# Patient Record
Sex: Male | Born: 1950 | Race: White | Hispanic: No | Marital: Married | Smoking: Never smoker
Health system: Southern US, Community
[De-identification: ages and names within clinical notes are randomized; demographics above are authoritative.]

## PROBLEM LIST (undated history)

## (undated) DIAGNOSIS — K759 Inflammatory liver disease, unspecified: Secondary | ICD-10-CM

## (undated) DIAGNOSIS — G459 Transient cerebral ischemic attack, unspecified: Secondary | ICD-10-CM

## (undated) DIAGNOSIS — N200 Calculus of kidney: Secondary | ICD-10-CM

## (undated) DIAGNOSIS — K219 Gastro-esophageal reflux disease without esophagitis: Secondary | ICD-10-CM

## (undated) DIAGNOSIS — E785 Hyperlipidemia, unspecified: Secondary | ICD-10-CM

## (undated) HISTORY — DX: Calculus of kidney: N20.0

## (undated) HISTORY — DX: Inflammatory liver disease, unspecified: K75.9

## (undated) HISTORY — DX: Hyperlipidemia, unspecified: E78.5

## (undated) HISTORY — DX: Gastro-esophageal reflux disease without esophagitis: K21.9

## (undated) HISTORY — PX: CHOLECYSTECTOMY: SHX55

## (undated) HISTORY — DX: Transient cerebral ischemic attack, unspecified: G45.9

## (undated) HISTORY — PX: HERNIA REPAIR: SHX51

---

## 2009-07-01 LAB — HM COLONOSCOPY: HM Colonoscopy: NORMAL

## 2014-03-11 LAB — HEPATIC FUNCTION PANEL
ALK PHOS: 63 U/L (ref 25–125)
ALT: 27 U/L (ref 10–40)
AST: 24 U/L (ref 14–40)
Bilirubin, Total: 1.9 mg/dL

## 2014-03-11 LAB — TSH: TSH: 1.92 u[IU]/mL (ref <=5.90)

## 2014-03-11 LAB — CBC AND DIFFERENTIAL
HCT: 47 % (ref 41–53)
Hemoglobin: 16.3 g/dL (ref 13.5–17.5)
NEUTROS ABS: 65 /uL
Platelets: 151 10*3/uL (ref 150–399)
WBC: 5.8 10*3/mL

## 2014-03-11 LAB — LIPID PANEL
Cholesterol: 191 mg/dL (ref 0–200)
HDL: 33 mg/dL — AB (ref 35–70)
LDL CALC: 126 mg/dL
LDL/HDL RATIO: 3.8
Triglycerides: 159 mg/dL (ref 40–160)

## 2014-03-11 LAB — BASIC METABOLIC PANEL
BUN: 12 mg/dL (ref 4–21)
CREATININE: 1 mg/dL (ref <=1.3)
Glucose: 100 mg/dL
Potassium: 4.2 mmol/L (ref 3.4–5.3)
Sodium: 142 mmol/L (ref 137–147)

## 2014-07-16 DIAGNOSIS — Z87442 Personal history of urinary calculi: Secondary | ICD-10-CM | POA: Insufficient documentation

## 2014-07-16 DIAGNOSIS — Z8673 Personal history of transient ischemic attack (TIA), and cerebral infarction without residual deficits: Secondary | ICD-10-CM | POA: Insufficient documentation

## 2014-07-16 DIAGNOSIS — R413 Other amnesia: Secondary | ICD-10-CM | POA: Insufficient documentation

## 2014-07-16 DIAGNOSIS — G4733 Obstructive sleep apnea (adult) (pediatric): Secondary | ICD-10-CM | POA: Insufficient documentation

## 2014-07-16 DIAGNOSIS — K219 Gastro-esophageal reflux disease without esophagitis: Secondary | ICD-10-CM | POA: Insufficient documentation

## 2014-07-16 DIAGNOSIS — E559 Vitamin D deficiency, unspecified: Secondary | ICD-10-CM | POA: Insufficient documentation

## 2014-07-16 DIAGNOSIS — Z8619 Personal history of other infectious and parasitic diseases: Secondary | ICD-10-CM | POA: Insufficient documentation

## 2014-07-16 DIAGNOSIS — J309 Allergic rhinitis, unspecified: Secondary | ICD-10-CM | POA: Insufficient documentation

## 2014-07-16 DIAGNOSIS — E785 Hyperlipidemia, unspecified: Secondary | ICD-10-CM | POA: Insufficient documentation

## 2014-07-29 ENCOUNTER — Encounter
Admission: RE | Disposition: A | Discharge status: Home / Self Care | Payer: Self-pay | Source: Ambulatory Visit | Attending: Gastroenterology

## 2014-07-29 ENCOUNTER — Ambulatory Visit: Payer: 59 | Admitting: Anesthesiology

## 2014-07-29 ENCOUNTER — Ambulatory Visit
Admission: RE | Admit: 2014-07-29 | Discharge: 2014-07-29 | Disposition: A | Discharge status: Home / Self Care | Payer: 59 | Source: Ambulatory Visit | Attending: Gastroenterology | Admitting: Gastroenterology

## 2014-07-29 ENCOUNTER — Encounter: Payer: Self-pay | Admitting: *Deleted

## 2014-07-29 DIAGNOSIS — K5730 Diverticulosis of large intestine without perforation or abscess without bleeding: Secondary | ICD-10-CM | POA: Insufficient documentation

## 2014-07-29 DIAGNOSIS — K759 Inflammatory liver disease, unspecified: Secondary | ICD-10-CM | POA: Insufficient documentation

## 2014-07-29 DIAGNOSIS — I69851 Hemiplegia and hemiparesis following other cerebrovascular disease affecting right dominant side: Secondary | ICD-10-CM | POA: Diagnosis not present

## 2014-07-29 DIAGNOSIS — N200 Calculus of kidney: Secondary | ICD-10-CM | POA: Insufficient documentation

## 2014-07-29 DIAGNOSIS — Z86010 Personal history of colonic polyps: Secondary | ICD-10-CM | POA: Diagnosis not present

## 2014-07-29 DIAGNOSIS — I69892 Facial weakness following other cerebrovascular disease: Secondary | ICD-10-CM | POA: Insufficient documentation

## 2014-07-29 DIAGNOSIS — Z1211 Encounter for screening for malignant neoplasm of colon: Secondary | ICD-10-CM | POA: Diagnosis not present

## 2014-07-29 DIAGNOSIS — K635 Polyp of colon: Secondary | ICD-10-CM | POA: Insufficient documentation

## 2014-07-29 DIAGNOSIS — K219 Gastro-esophageal reflux disease without esophagitis: Secondary | ICD-10-CM | POA: Insufficient documentation

## 2014-07-29 DIAGNOSIS — Z7982 Long term (current) use of aspirin: Secondary | ICD-10-CM | POA: Insufficient documentation

## 2014-07-29 DIAGNOSIS — Z79899 Other long term (current) drug therapy: Secondary | ICD-10-CM | POA: Insufficient documentation

## 2014-07-29 DIAGNOSIS — G4730 Sleep apnea, unspecified: Secondary | ICD-10-CM | POA: Insufficient documentation

## 2014-07-29 HISTORY — PX: COLONOSCOPY: SHX5424

## 2014-07-29 SURGERY — COLONOSCOPY
Anesthesia: Choice

## 2014-07-29 SURGERY — COLONOSCOPY
Anesthesia: General

## 2014-07-29 MED ORDER — SODIUM CHLORIDE 0.9 % IV SOLN: INTRAVENOUS | Status: DC

## 2014-07-29 MED ORDER — PROPOFOL INFUSION 10 MG/ML OPTIME
INTRAVENOUS | Status: DC | PRN
Start: 2014-07-29 — End: 2014-07-29
  Administered 2014-07-29: 160 ug/kg/min via INTRAVENOUS

## 2014-07-29 MED ORDER — FENTANYL CITRATE (PF) 100 MCG/2ML IJ SOLN
INTRAMUSCULAR | Status: DC | PRN
  Administered 2014-07-29: 25 ug via INTRAVENOUS

## 2014-07-29 MED ORDER — SODIUM CHLORIDE 0.9 % IV SOLN
INTRAVENOUS | Status: DC
  Administered 2014-07-29: 10:00:00 via INTRAVENOUS

## 2014-07-29 MED ORDER — MIDAZOLAM HCL 2 MG/2ML IJ SOLN
INTRAMUSCULAR | Status: DC | PRN
Start: 2014-07-29 — End: 2014-07-29
  Administered 2014-07-29: 1 mg via INTRAVENOUS

## 2014-07-29 NOTE — Anesthesia Preprocedure Evaluation (Signed)
Anesthesia Evaluation  Patient identified by MRN, date of birth, ID band Patient awake    Reviewed: Allergy & Precautions, NPO status , Patient's Chart, lab work & pertinent test results  Airway Mallampati: II  TM Distance: >3 FB Neck ROM: Full    Dental  (+) Teeth Intact   Pulmonary sleep apnea and Continuous Positive Airway Pressure Ventilation ,          Cardiovascular     Neuro/Psych TIA (R sided weakness, facial droop)   GI/Hepatic GERD-  ,(+) Hepatitis - (Gilbert syndrome), A  Endo/Other    Renal/GU Renal disease (Stones)     Musculoskeletal   Abdominal   Peds  Hematology   Anesthesia Other Findings   Reproductive/Obstetrics                             Anesthesia Physical Anesthesia Plan  ASA: II  Anesthesia Plan: General   Post-op Pain Management:    Induction: Intravenous  Airway Management Planned: Nasal Cannula  Additional Equipment:   Intra-op Plan:   Post-operative Plan:   Informed Consent: I have reviewed the patients History and Physical, chart, labs and discussed the procedure including the risks, benefits and alternatives for the proposed anesthesia with the patient or authorized representative who has indicated his/her understanding and acceptance.     Plan Discussed with:   Anesthesia Plan Comments:         Anesthesia Quick Evaluation

## 2014-07-29 NOTE — Transfer of Care (Signed)
Immediate Anesthesia Transfer of Care Note  Patient: Zachary Medina  Procedure(s) Performed: Procedure(s): COLONOSCOPY (N/A)  Patient Location: PACU and Endoscopy Unit  Anesthesia Type:General  Level of Consciousness: sedated and responds to stimulation  Airway & Oxygen Therapy: Patient Spontanous Breathing and Patient connected to nasal cannula oxygen  Post-op Assessment: Report given to RN and Post -op Vital signs reviewed and stable  Post vital signs: Reviewed and stable  Last Vitals:  Filed Vitals:   07/29/14 0938  BP: 120/68  Pulse: 54  Temp: 35.9 C  Resp: 18    Complications: No apparent anesthesia complications

## 2014-07-29 NOTE — Anesthesia Postprocedure Evaluation (Signed)
  Anesthesia Post-op Note  Patient: Zachary Medina  Procedure(s) Performed: Procedure(s): COLONOSCOPY (N/A)  Anesthesia type:General  Patient location: PACU  Post pain: Pain level controlled  Post assessment: Post-op Vital signs reviewed, Patient's Cardiovascular Status Stable, Respiratory Function Stable, Patent Airway and No signs of Nausea or vomiting  Post vital signs: Reviewed and stable  Last Vitals:  Filed Vitals:   07/29/14 0938  BP: 120/68  Pulse: 54  Temp: 35.9 C  Resp: 18    Level of consciousness: awake, alert  and patient cooperative  Complications: No apparent anesthesia complications

## 2014-07-29 NOTE — Op Note (Signed)
[facility] Patient Name: Zachary Medina Procedure Date: 07/29/2014 10:29 AM MRN: [id number] Account #: [id number] Date of Birth: 08-04-1950 Admit Type: Outpatient Age: 64 Room: [facility] ENDO ROOM 4 Gender: Male Note Status: Finalized Procedure:         Colonoscopy Indications:       Screening for colorectal malignant neoplasm Providers:         Lucilla Lame, MD Referring MD:      Janine Ores. Rosanna Randy, MD (Referring MD) Medicines:         Propofol per Anesthesia Complications:     No immediate complications. Procedure:         Pre-Anesthesia Assessment:                    - Prior to the procedure, a History and Physical was                     performed, and patient medications and allergies were                     reviewed. The patient's tolerance of previous anesthesia                     was also reviewed. The risks and benefits of the procedure                     and the sedation options and risks were discussed with the                     patient. All questions were answered, and informed consent                     was obtained. Prior Anticoagulants: The patient has taken                     no previous anticoagulant or antiplatelet agents. ASA                     Grade Assessment: II - A patient with mild systemic                     disease. After reviewing the risks and benefits, the                     patient was deemed in satisfactory condition to undergo                     the procedure.                    After obtaining informed consent, the colonoscope was                     passed under direct vision. Throughout the procedure, the                     patient's blood pressure, pulse, and oxygen saturations                     were monitored continuously. The Olympus CF-Q160AL                     colonoscope (S#. [id number]) was introduced through the anus  and advanced to the the cecum, identified by appendiceal                orifice and ileocecal valve. The colonoscopy was performed                     without difficulty. The patient tolerated the procedure                     well. The quality of the bowel preparation was excellent. Findings:      The perianal and digital rectal examinations were normal.      Four sessile polyps were found in the sigmoid colon. The polyps were 2       to 4 mm in size. These polyps were removed with a cold biopsy forceps.       Resection and retrieval were complete.      A few small-mouthed diverticula were found in the sigmoid colon. Impression:        - Four 2 to 4 mm polyps in the sigmoid colon. Resected and                     retrieved.                    - Diverticulosis in the sigmoid colon. Recommendation:    - Await pathology results.                    - Repeat colonoscopy in 5 years if polyp adenoma and 10                     years if hyperplastic Procedure Code(s): --- Professional ---                    [phone], Colonoscopy, flexible; with biopsy, single or                     multiple Diagnosis Code(s): --- Professional ---                    Z12.11, Encounter for screening for malignant neoplasm of                     colon                    D12.5, Benign neoplasm of sigmoid colon CPT copyright 2014 American Medical Association. All rights reserved. The codes documented in this report are preliminary and upon coder review may  be revised to meet current compliance requirements. Lucilla Lame, MD 07/29/2014 10:55:58 AM This report has been signed electronically. Number of Addenda: 0 Note Initiated On: 07/29/2014 10:29 AM Scope Withdrawal Time: 0 hours 9 minutes 11 seconds  Total Procedure Duration: 0 hours 11 minutes 41 seconds       [facility]

## 2014-07-29 NOTE — Anesthesia Procedure Notes (Signed)
Performed by: Tiera Mensinger Pre-anesthesia Checklist: Patient identified, Emergency Drugs available, Suction available, Patient being monitored and Timeout performed Patient Re-evaluated:Patient Re-evaluated prior to inductionOxygen Delivery Method: Nasal cannula Preoxygenation: Pre-oxygenation with 100% oxygen Intubation Type: IV induction     

## 2014-07-29 NOTE — H&P (Signed)
  [facility]  [address] [place], [place] [address] Phone: [phone] Fax : [phone]  Primary Care Physician:  Miguel Aschoff, MD Primary Gastroenterologist:  Dr. Allen Norris  Pre-Procedure History & Physical: HPI:  Zachary Medina is a 64 y.o. male is here for an colonoscopy.   History reviewed. No pertinent past medical history.  Past Surgical History  Procedure Laterality Date  . Hernia repair    . Cholecystectomy      Prior to Admission medications   Medication Sig Start Date End Date Taking? Authorizing Provider  aspirin 81 MG tablet Take 81 mg by mouth daily.    Yes Historical Provider, MD    Allergies as of 07/11/2014  . (Not on File)    Family History  Problem Relation Age of Onset  . Arthritis Mother   . Glaucoma Mother   . Congestive Heart Failure Father   . Parkinson's disease Father   . Prostate cancer Father   . Arthritis Father   . Heart disease Father   . Diabetes Brother     History   Social History  . Marital Status: Married    Spouse Name: N/A  . Number of Children: N/A  . Years of Education: N/A   Occupational History  . Not on file.   Social History Main Topics  . Smoking status: Never Smoker   . Smokeless tobacco: Not on file  . Alcohol Use: No  . Drug Use: No  . Sexual Activity: Not on file   Other Topics Concern  . Not on file   Social History Narrative    Review of Systems: See HPI, otherwise negative ROS  Physical Exam: BP 120/68 mmHg  Pulse 54  Temp(Src) 96.7 F (35.9 C) (Tympanic)  Resp 18  Ht 5\' 8"  (1.727 m)  Wt 165 lb (74.844 kg)  BMI 25.09 kg/m2  SpO2 100% General:   Alert,  pleasant and cooperative in NAD Head:  Normocephalic and atraumatic. Neck:  Supple; no masses or thyromegaly. Lungs:  Clear throughout to auscultation.    Heart:  Regular rate and rhythm. Abdomen:  Soft, nontender and nondistended. Normal bowel sounds, without guarding, and without rebound.   Neurologic:  Alert  and  oriented x4;  grossly normal neurologically.  Impression/Plan: Zachary Medina is here for an colonoscopy to be performed for history of polyps  Risks, benefits, limitations, and alternatives regarding  colonoscopy have been reviewed with the patient.  Questions have been answered.  All parties agreeable.   [facility], MD  07/29/2014, 10:13 AM

## 2014-07-30 LAB — SURGICAL PATHOLOGY

## 2014-08-05 ENCOUNTER — Encounter: Payer: Self-pay | Admitting: Gastroenterology

## 2014-08-25 ENCOUNTER — Ambulatory Visit: Payer: Self-pay | Admitting: Family Medicine

## 2014-09-01 ENCOUNTER — Encounter: Payer: Self-pay | Admitting: Family Medicine

## 2014-09-01 ENCOUNTER — Ambulatory Visit (INDEPENDENT_AMBULATORY_CARE_PROVIDER_SITE_OTHER): Payer: 59 | Admitting: Family Medicine

## 2014-09-01 VITALS — BP 118/76 | HR 64 | Temp 97.7°F | Resp 16 | Wt 171.0 lb

## 2014-09-01 DIAGNOSIS — M533 Sacrococcygeal disorders, not elsewhere classified: Secondary | ICD-10-CM

## 2014-09-01 DIAGNOSIS — M722 Plantar fascial fibromatosis: Secondary | ICD-10-CM | POA: Diagnosis not present

## 2014-09-01 DIAGNOSIS — K219 Gastro-esophageal reflux disease without esophagitis: Secondary | ICD-10-CM | POA: Diagnosis not present

## 2014-09-01 MED ORDER — NAPROXEN 500 MG PO TABS: 500.0000 mg | ORAL_TABLET | Freq: Two times a day (BID) | ORAL | Status: DC

## 2014-09-01 NOTE — Progress Notes (Signed)
Patient ID: Zachary Medina, male   DOB: 1950-05-13, 64 y.o.   MRN: [id number]   Xaivier Malay  MRN: [id number] DOB: 06-27-1950  Subjective:  HPI  1. Gastroesophageal reflux disease, esophagitis presence not specified Patient was seen about 2 months for acid reflux and was treated with Zantac.  He took it daily for about 1 month and now only takes it as needed, which is a couple times per week.  He does notice that when he eats fast, is anxious or stressed he has more problems.  2. Coccyx pain Patient states he has been having tenderness that he believes to be his tailbone.  He notices it mostly when sitting.  It has been going on for 1 month.  He states he has never had an injury to the area, he recently had colonoscopy with removal of some polyps.  He had normal PSA with his PE in 2016.   Patient Active Problem List   Diagnosis Date Noted  . Allergic rhinitis 07/16/2014  . Bilirubinemia 07/16/2014  . Acid reflux 07/16/2014  . 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 H/O renal calculi 07/16/2014  . H/O transient cerebral ischemia 07/16/2014  . Bad memory 07/16/2014  . Obstructive apnea 07/16/2014  . HLD (hyperlipidemia) 07/16/2014  . Avitaminosis D 07/16/2014    Past Medical History  Diagnosis Date  . GERD (gastroesophageal reflux disease)   . Hyperlipidemia   . Hepatitis   . TIA (transient ischemic attack)   . Kidney stones     History   Social History  . Marital Status: Married    Spouse Name: N/A  . Number of Children: N/A  . Years of Education: N/A   Occupational History  . Not on file.   Social History Main Topics  . Smoking status: Never Smoker   . Smokeless tobacco: Not on file  . Alcohol Use: No  . Drug Use: No  . Sexual Activity: Not on file   Other Topics Concern  . Not on file   Social History Narrative    Outpatient Prescriptions Prior to Visit  Medication Sig Dispense Refill  . aspirin 81 MG tablet Take 81 mg by mouth daily.      No  facility-administered medications prior to visit.    No Known Allergies  Review of Systems  Respiratory: Positive for cough (chronic with no change).   Cardiovascular: Negative.   Gastrointestinal: Positive for diarrhea (Occassional loose stool after eating our.).       Patient describes his discomfort as a lump in his throat and not really heartburn.  Musculoskeletal: Negative.        Heel pain when he first gets up.   Objective:  BP 118/76 mmHg  Pulse 64  Temp(Src) 97.7 F (36.5 C) (Oral)  Resp 16  Wt 171 lb (77.565 kg)  Physical Exam  Constitutional: He is well-developed, well-nourished, and in no distress.  Pulmonary/Chest: Effort normal and breath sounds normal.  Musculoskeletal:  Tenderness over the coccyx Tenderness at the proximal plantar   Skin: Skin is warm and dry.  Psychiatric: Mood, memory, affect and judgment normal.    Assessment and Plan :   1. Gastroesophageal reflux disease, esophagitis presence not specified   2. Coccyx pain/Coccyxdynia Advised changing use of hard chair at work.  Will x-ray to rule out anything worrisome. May need orthopedic referral if this does not get better. He is naproxen for 2-3 weeks.  3. Plantar Fasciitis-advised support shoes and stretches.    Richard  [facility] 09/01/2014 3:44 PM

## 2014-09-24 ENCOUNTER — Ambulatory Visit
Admission: RE | Admit: 2014-09-24 | Discharge: 2014-09-24 | Disposition: A | Discharge status: Home / Self Care | Payer: 59 | Source: Ambulatory Visit | Attending: Family Medicine | Admitting: Family Medicine

## 2014-09-24 DIAGNOSIS — M533 Sacrococcygeal disorders, not elsewhere classified: Secondary | ICD-10-CM | POA: Diagnosis not present

## 2014-09-24 IMAGING — CR DG SACRUM/COCCYX 2+V
1 series · 3 of 3 positions shown · non-contrast
Comparison: None.

CLINICAL DATA: Tenderness.  No known injury.  Initial evaluation .

EXAM:
SACRUM AND COCCYX - 2+ VIEW

[Series 1: dg sacrum/coccyx · 0.14mm/px · 3 of 3 slices shown]
[im 1/3]
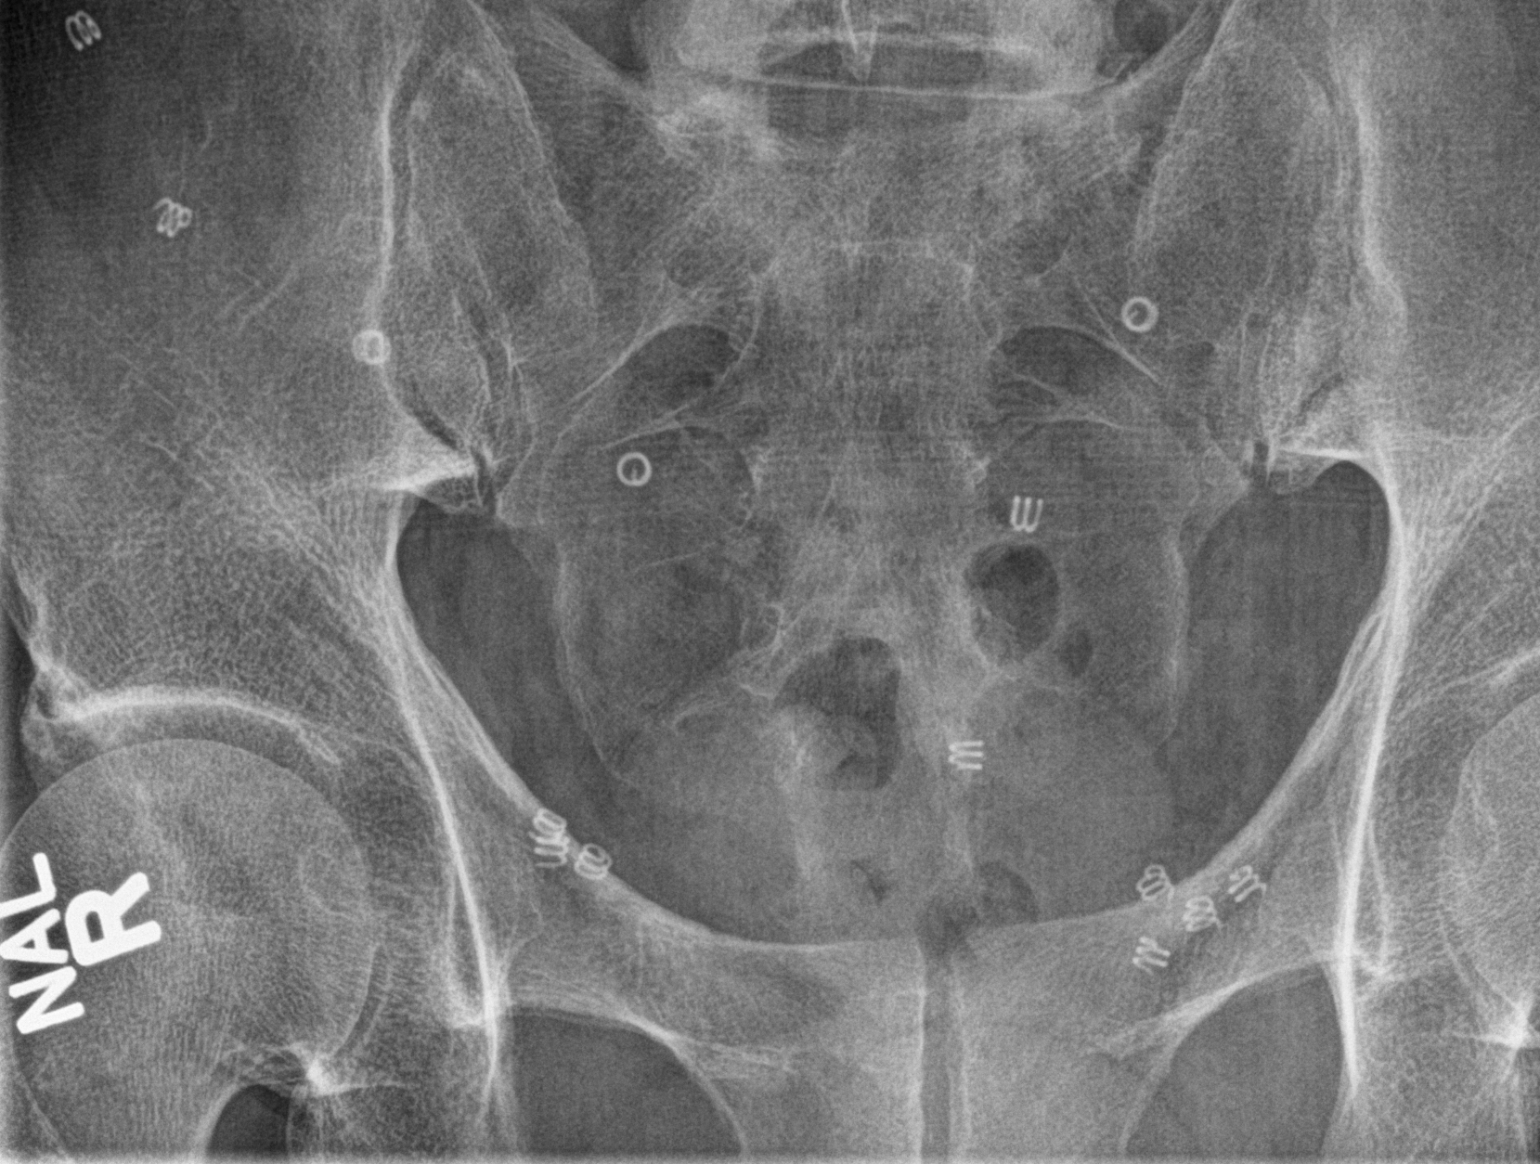
[im 2/3]
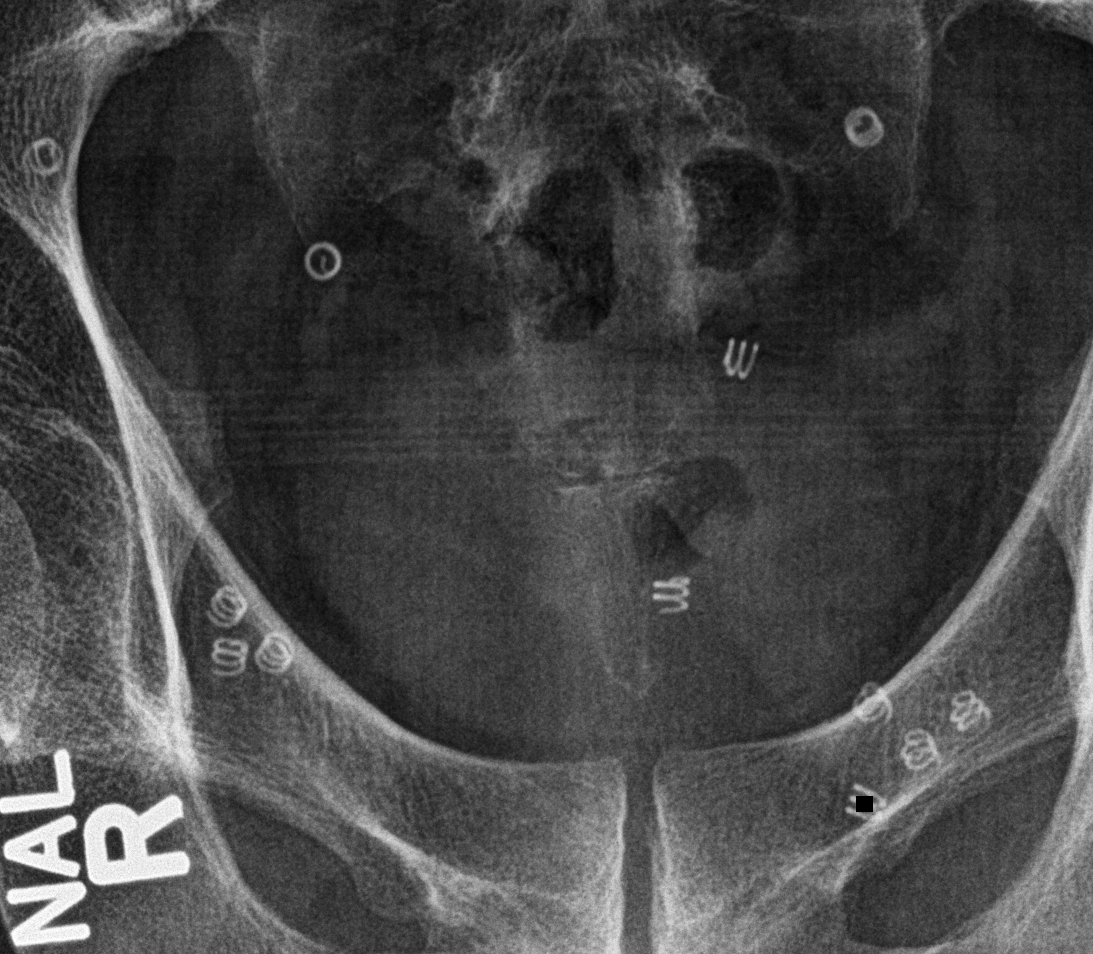
[im 3/3]
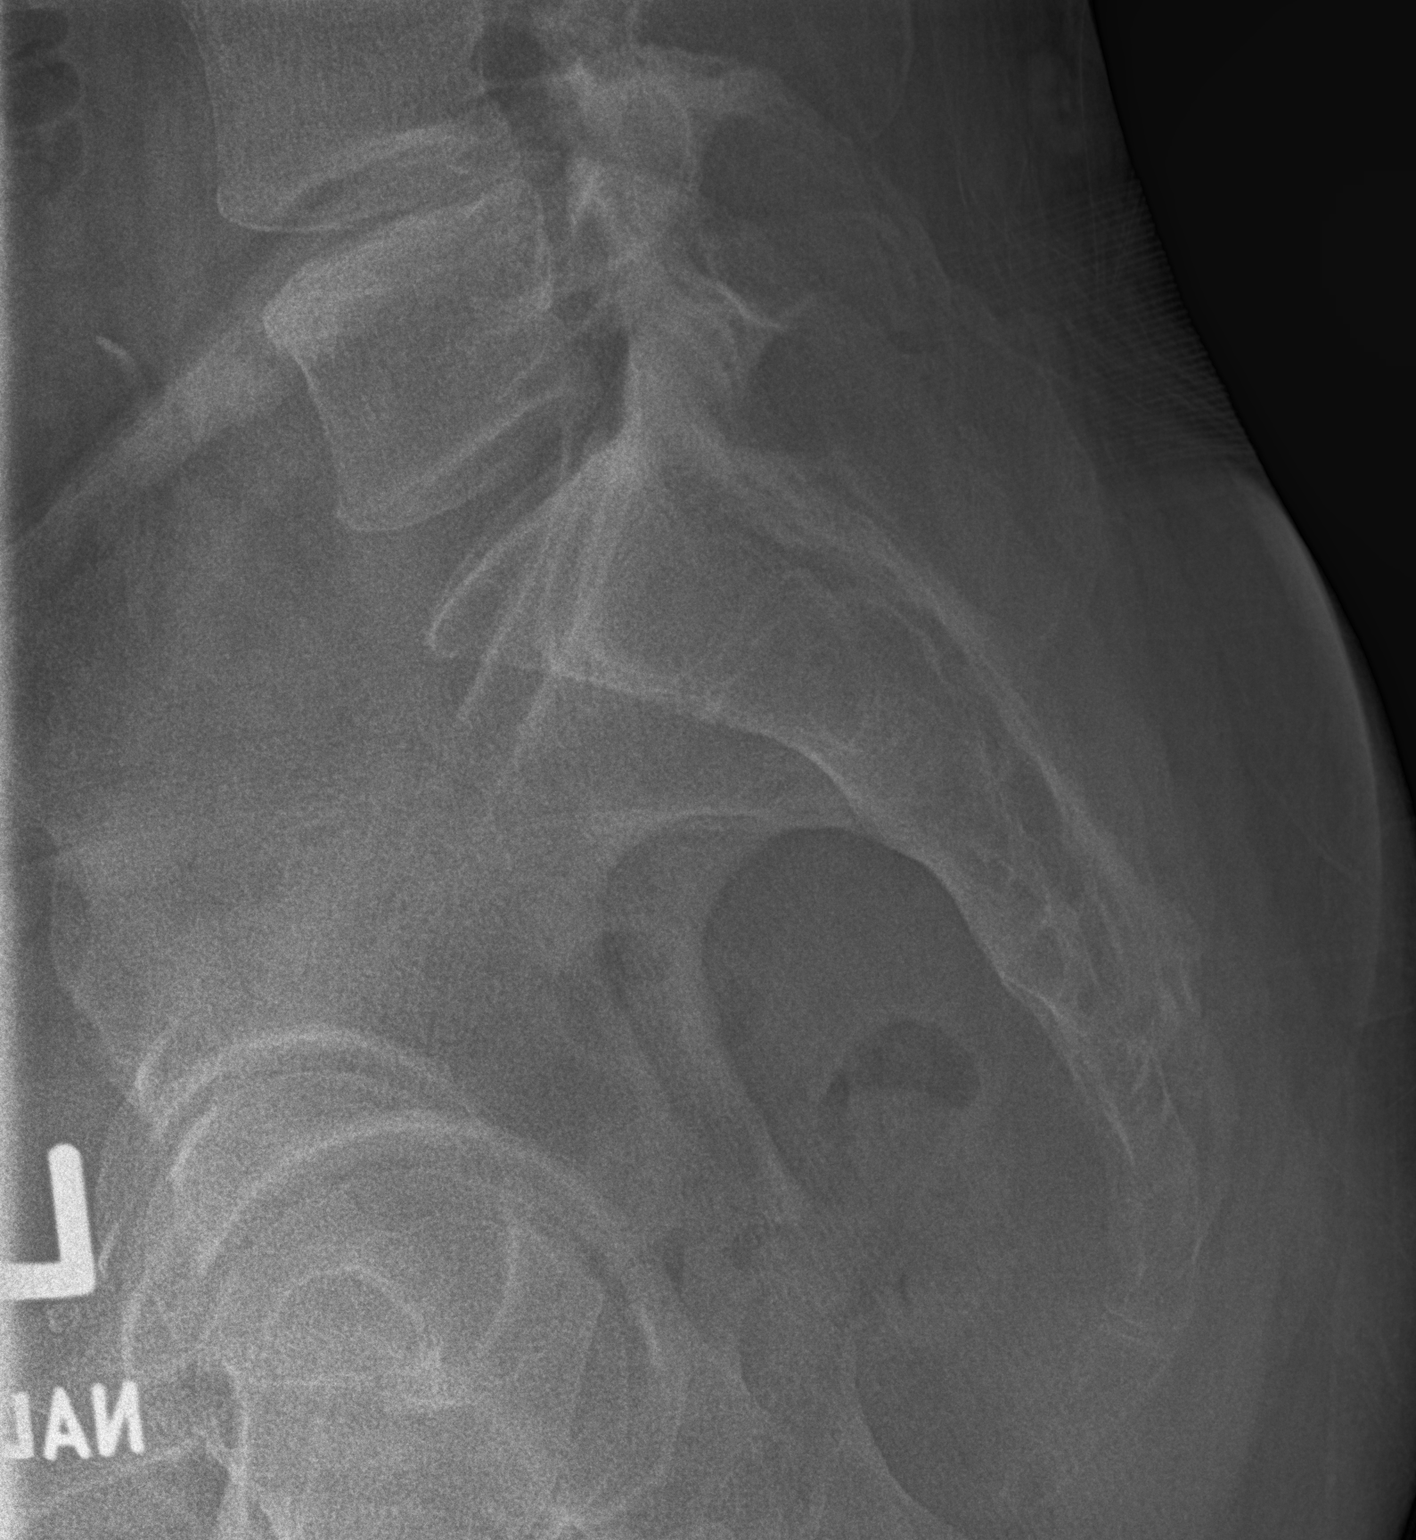

[3 of 3 positions shown; findings below may reference images not displayed]

FINDINGS: Prior hernia repair. No acute soft tissue bony abnormality
identified. Mild degenerative changes SI joints both hips..
IMPRESSION: No acute abnormality. Mild degenerative changes SI joints and both
hips.

## 2014-09-25 ENCOUNTER — Telehealth: Payer: Self-pay

## 2014-09-25 NOTE — Telephone Encounter (Signed)
Advised  ED 

## 2014-09-25 NOTE — Telephone Encounter (Signed)
-----   Message from Jerrol Banana., MD sent at 09/24/2014  4:53 PM EDT ----- Arthritis of the hips and back noted

## 2014-10-10 ENCOUNTER — Other Ambulatory Visit: Payer: Self-pay | Admitting: *Deleted

## 2014-10-10 DIAGNOSIS — M722 Plantar fascial fibromatosis: Secondary | ICD-10-CM

## 2014-10-10 MED ORDER — NAPROXEN 500 MG PO TABS: 500.0000 mg | ORAL_TABLET | Freq: Two times a day (BID) | ORAL | Status: DC

## 2014-10-10 NOTE — Telephone Encounter (Signed)
See below-aa 

## 2014-10-10 NOTE — Telephone Encounter (Signed)
Done-aa 

## 2014-10-10 NOTE — Telephone Encounter (Signed)
Okay to refill this for one year.

## 2014-10-10 NOTE — Telephone Encounter (Signed)
Pt needs a refilled of his Naproxen. His pharmacy is [facility] on [address].

## 2015-03-04 ENCOUNTER — Other Ambulatory Visit: Payer: Self-pay | Admitting: Emergency Medicine

## 2015-03-04 DIAGNOSIS — J329 Chronic sinusitis, unspecified: Secondary | ICD-10-CM

## 2015-03-04 MED ORDER — AZITHROMYCIN 250 MG PO TABS: ORAL_TABLET | ORAL | Status: DC

## 2015-07-01 ENCOUNTER — Telehealth: Payer: Self-pay

## 2015-07-01 ENCOUNTER — Ambulatory Visit (INDEPENDENT_AMBULATORY_CARE_PROVIDER_SITE_OTHER): Payer: 59 | Admitting: Family Medicine

## 2015-07-01 ENCOUNTER — Encounter: Payer: Self-pay | Admitting: Family Medicine

## 2015-07-01 VITALS — BP 118/62 | HR 54 | Temp 97.8°F | Resp 14 | Wt 169.0 lb

## 2015-07-01 DIAGNOSIS — R002 Palpitations: Secondary | ICD-10-CM

## 2015-07-01 DIAGNOSIS — G4733 Obstructive sleep apnea (adult) (pediatric): Secondary | ICD-10-CM

## 2015-07-01 NOTE — Progress Notes (Signed)
Patient ID: Zachary Medina, male   DOB: 08-25-1950, 65 y.o.   MRN: [id number]    Subjective:  HPI  Patient states that this morning when he woke up he felt his heart racing /pounding, felt lightheaded, had some chest achy sensation on the left side. This lasted for hours. Symptoms are resolved at this time except for feeling lightheaded and having a dull headache. He does states that he has not had anything to eat so far today.  He did take Aspirin 81 mg at 3 am and another one at 11am.  Prior to Admission medications   Medication Sig Start Date End Date Taking? Authorizing Provider  aspirin 81 MG tablet Take 81 mg by mouth daily.    Yes Historical Provider, MD  Omega 3 1000 MG CAPS Take by mouth.   Yes Historical Provider, MD  Vitamin D, Cholecalciferol, 1000 units TABS Take by mouth.   Yes Historical Provider, MD    Patient Active Problem List   Diagnosis Date Noted  . Allergic rhinitis 07/16/2014  . Bilirubinemia 07/16/2014  . Acid reflux 07/16/2014  . 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 H/O renal calculi 07/16/2014  . H/O transient cerebral ischemia 07/16/2014  . Bad memory 07/16/2014  . Obstructive apnea 07/16/2014  . HLD (hyperlipidemia) 07/16/2014  . Avitaminosis D 07/16/2014    Past Medical History  Diagnosis Date  . GERD (gastroesophageal reflux disease)   . Hyperlipidemia   . Hepatitis   . TIA (transient ischemic attack)   . Kidney stones     Social History   Social History  . Marital Status: Married    Spouse Name: N/A  . Number of Children: N/A  . Years of Education: N/A   Occupational History  . Not on file.   Social History Main Topics  . Smoking status: Never Smoker   . Smokeless tobacco: Never Used  . Alcohol Use: No  . Drug Use: No  . Sexual Activity: Not on file   Other Topics Concern  . Not on file   Social History Narrative    No Known Allergies  Review of Systems  Constitutional: Negative.   HENT: Negative.   Eyes: Negative.     Respiratory: Negative.   Cardiovascular: Negative.   Gastrointestinal: Negative.   Musculoskeletal: Negative.   Skin: Negative.   Neurological: Positive for dizziness.  Endo/Heme/Allergies: Negative.   Psychiatric/Behavioral: Negative.        Increased stress recently due to job issues. He is a Primary school teacher.    Immunization History  Administered Date(s) Administered  . Tdap 02/12/2010   Objective:  BP 118/62 mmHg  Pulse 54  Temp(Src) 97.8 F (36.6 C)  Resp 14  Wt 169 lb (76.658 kg)  SpO2 98%  Physical Exam  Constitutional: He is oriented to person, place, and time and well-developed, well-nourished, and in no distress.  HENT:  Head: Normocephalic and atraumatic.  Right Ear: External ear normal.  Left Ear: External ear normal.  Nose: Nose normal.  Eyes: Conjunctivae are normal.  Neck: Neck supple. No thyromegaly present.  Cardiovascular: Normal rate, regular rhythm and normal heart sounds.   Pulmonary/Chest: Effort normal and breath sounds normal.  Abdominal: Soft.  Lymphadenopathy:    He has no cervical adenopathy.  Neurological: He is alert and oriented to person, place, and time. No cranial nerve deficit. He exhibits normal muscle tone. Gait normal. Coordination normal. GCS score is 15.  Skin: Skin is warm and dry.  Psychiatric:  Mood, memory, affect and judgment normal.    Lab Results  Component Value Date   WBC 5.8 03/11/2014   HGB 16.3 03/11/2014   HCT 47 03/11/2014   PLT 151 03/11/2014   CHOL 191 03/11/2014   TRIG 159 03/11/2014   HDL 33* 03/11/2014   LDLCALC 126 03/11/2014   TSH 1.92 03/11/2014    CMP     Component Value Date/Time   NA 142 03/11/2014   K 4.2 03/11/2014   BUN 12 03/11/2014   CREATININE 1.0 03/11/2014   AST 24 03/11/2014   ALT 27 03/11/2014   ALKPHOS 63 03/11/2014    Assessment and Plan :  1. Palpitation Resolved at this time. EKG stable. Advised patient if this reoccurs will refer to cardiologist at that time.  Follow.I think this is a benign process at this time.more than 50% of this visit spent in counseling. - EKG 12-Lead  2. OSA Patient needs a new CPAP machine, he got it through [facility]. His is messed up and has not been able to use it in the past 2 months. 3. Mild dehydration Nation encouraged to work on increasing water intake during the day Patient was seen and examined by Dr. Eulas Post and note was scribed by Theressa Millard, [place].   Miguel Aschoff MD [facility] 07/01/2015 1:40 PM

## 2015-07-01 NOTE — Telephone Encounter (Signed)
Judson Roch i put order for a CPAP machine for this guy but I know in the past you could not see these orders for some reason so I wanted to send a message just in case. He got his machine before from [facility]. Sleep study is in [facility] under outside records. If you have any questions let me know. Thank you-aa

## 2015-07-01 NOTE — Telephone Encounter (Signed)
Patient advised and he will come at 1:30

## 2015-07-01 NOTE — Telephone Encounter (Signed)
See if he might want to come in closer to 1:30here  if not to ED?

## 2015-07-01 NOTE — Telephone Encounter (Signed)
FYI  Spoke with patient, he states he woke up this morning with feeling like his heart is racing and feeling dizzy, he states the racing sensation has improved now. He is still having dizziness, some tightness on the left side of the chest which has improved a little right now. Shortness of breath is present. No numbness or tingling sensation, speech sounded normal, no facial abnormalities. Patient has appointment set up to see [place] at 2 pm. I advised patient his symptoms sound like it can be a cardiac issue and that it would be best to go to ER due to they will have more equipment and procedures they can do if this is heart related. I repeated several times that it would be best to go to ER now. Patient states he understands he wants to see how he does from now till 2 pm and if he gets worst or anything he will go ahead and go to ER.-aa

## 2015-07-07 ENCOUNTER — Telehealth: Payer: Self-pay | Admitting: Family Medicine

## 2015-07-07 NOTE — Telephone Encounter (Signed)
Zachary Medina can we try to get him an auto titrating machine with pressures 5-20 cm.

## 2015-07-07 NOTE — Telephone Encounter (Signed)
Zachary Medina we tried to get him an auto titrating machine pressures being 5-20 cm of water?

## 2015-07-07 NOTE — Telephone Encounter (Signed)
Please review, per his sleep study settings should be from 5 to 10 not 60. Please let sarah know what setting. I didn't think 60 sounded right-aa

## 2015-07-07 NOTE — Telephone Encounter (Signed)
Pt is wanting to get new CPAP machine and was seen in office for this.Pt states that his pressure is set at 60 CM.Is this how you want prescription written ?

## 2015-07-07 NOTE — Telephone Encounter (Signed)
Per [facility].they have no pt by this name.Pt states that he would like to use different company

## 2015-07-09 NOTE — Telephone Encounter (Signed)
Order for Auto titrating CPAP faxed to [place].Their office will contact pt

## 2016-04-26 ENCOUNTER — Encounter: Payer: Self-pay | Admitting: Family Medicine

## 2016-04-26 ENCOUNTER — Ambulatory Visit (INDEPENDENT_AMBULATORY_CARE_PROVIDER_SITE_OTHER): Payer: 59 | Admitting: Family Medicine

## 2016-04-26 DIAGNOSIS — K529 Noninfective gastroenteritis and colitis, unspecified: Secondary | ICD-10-CM

## 2016-04-26 MED ORDER — RANITIDINE HCL 150 MG PO TABS: 150.0000 mg | ORAL_TABLET | Freq: Two times a day (BID) | ORAL | 3 refills | Status: DC

## 2016-04-26 MED ORDER — PROMETHAZINE HCL 25 MG PO TABS
25.0000 mg | ORAL_TABLET | ORAL | 1 refills | Status: DC | PRN
Start: 2016-04-26 — End: 2017-04-11

## 2016-04-26 NOTE — Progress Notes (Signed)
Subjective:  HPI Pt reports that he started feeling bad about 2 days ago and felt like he had a low grade fever. He did not check it. He started having diarrhea, nausea, abdominal pain in the belly button area. He reports that he's has an increased amount of reflux and when he burps it taste like a deviled egg that he had eaten on Sunday at church. Denies any fevers, chills respiratory symptoms. The abdominal pain is described as stabbing. He has had his gall bladder removed. He is around a lit of people all the time with his church.  He has also been having leg cramps.  Prior to Admission medications   Medication Sig Start Date End Date Taking? Authorizing Provider  aspirin 81 MG tablet Take 81 mg by mouth daily.    Yes Historical Provider, MD  Omega 3 1000 MG CAPS Take by mouth.   Yes Historical Provider, MD  Vitamin D, Cholecalciferol, 1000 units TABS Take by mouth.   Yes Historical Provider, MD    Patient Active Problem List   Diagnosis Date Noted  . Allergic rhinitis 07/16/2014  . Bilirubinemia 07/16/2014  . Acid reflux 07/16/2014  . 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 H/O renal calculi 07/16/2014  . H/O transient cerebral ischemia 07/16/2014  . Bad memory 07/16/2014  . Obstructive apnea 07/16/2014  . HLD (hyperlipidemia) 07/16/2014  . Avitaminosis D 07/16/2014    Past Medical History:  Diagnosis Date  . GERD (gastroesophageal reflux disease)   . Hepatitis   . Hyperlipidemia   . Kidney stones   . TIA (transient ischemic attack)     Social History   Social History  . Marital status: Married    Spouse name: N/A  . Number of children: N/A  . Years of education: N/A   Occupational History  . Not on file.   Social History Main Topics  . Smoking status: Never Smoker  . Smokeless tobacco: Never Used  . Alcohol use No  . Drug use: No  . Sexual activity: Not on file   Other Topics Concern  . Not on file   Social History Narrative  . No narrative on file      No Known Allergies  Review of Systems  HENT: Negative.   Eyes: Negative.   Respiratory: Negative.   Cardiovascular: Negative.   Gastrointestinal: Positive for abdominal pain, diarrhea, heartburn and nausea.  Genitourinary: Negative.   Musculoskeletal: Positive for myalgias (cramps).  Skin: Negative.   Neurological: Negative.   Endo/Heme/Allergies: Negative.   Psychiatric/Behavioral: Negative.     Immunization History  Administered Date(s) Administered  . Tdap 02/12/2010    Objective:  BP 108/72 (BP Location: Left Arm, Patient Position: Sitting, Cuff Size: Normal)   Pulse 80   Temp 97.5 F (36.4 C) (Oral)   Resp 16   Wt 170 lb (77.1 kg)   BMI 25.85 kg/m   Physical Exam  Constitutional: He is oriented to person, place, and time and well-developed, well-nourished, and in no distress.  HENT:  Head: Normocephalic and atraumatic.  Right Ear: External ear normal.  Left Ear: External ear normal.  Nose: Nose normal.  Eyes: Conjunctivae are normal. No scleral icterus.  Neck: No thyromegaly present.  Cardiovascular: Normal rate, regular rhythm and normal heart sounds.   Pulmonary/Chest: Effort normal and breath sounds normal.  Lymphadenopathy:    He has no cervical adenopathy.  Neurological: He is alert and oriented to person, place, and time. Gait normal. GCS score  is 15.  Skin: Skin is warm and dry.  Psychiatric: Mood, memory, affect and judgment normal.    Lab Results  Component Value Date   WBC 5.8 03/11/2014   HGB 16.3 03/11/2014   HCT 47 03/11/2014   PLT 151 03/11/2014   CHOL 191 03/11/2014   TRIG 159 03/11/2014   HDL 33 (A) 03/11/2014   LDLCALC 126 03/11/2014   TSH 1.92 03/11/2014    CMP     Component Value Date/Time   NA 142 03/11/2014   K 4.2 03/11/2014   BUN 12 03/11/2014   CREATININE 1.0 03/11/2014   AST 24 03/11/2014   ALT 27 03/11/2014   ALKPHOS 63 03/11/2014    Assessment and Plan :   1. Gastroenteritis  - ranitidine (ZANTAC) 150  MG tablet; Take 1 tablet (150 mg total) by mouth 2 (two) times daily.  Dispense: 60 tablet; Refill: 3 - promethazine (PHENERGAN) 25 MG tablet; Take 1 tablet (25 mg total) by mouth every 4 (four) hours as needed for nausea or vomiting.  Dispense: 50 tablet; Refill: 1 I have done the exam and reviewed the above chart and it is accurate to the best of my knowledge. Development worker, community has been used in this note in any air is in the dictation or transcription are unintentional.  [facility] 04/26/2016 10:43 AM

## 2016-10-10 ENCOUNTER — Encounter: Payer: Self-pay | Admitting: Family Medicine

## 2016-10-10 ENCOUNTER — Ambulatory Visit (INDEPENDENT_AMBULATORY_CARE_PROVIDER_SITE_OTHER): Payer: 59 | Admitting: Family Medicine

## 2016-10-10 VITALS — BP 124/82 | HR 66 | Temp 97.7°F | Resp 16 | Ht 68.0 in | Wt 172.0 lb

## 2016-10-10 DIAGNOSIS — R0609 Other forms of dyspnea: Secondary | ICD-10-CM

## 2016-10-10 DIAGNOSIS — R002 Palpitations: Secondary | ICD-10-CM

## 2016-10-10 DIAGNOSIS — R42 Dizziness and giddiness: Secondary | ICD-10-CM | POA: Diagnosis not present

## 2016-10-10 DIAGNOSIS — Z125 Encounter for screening for malignant neoplasm of prostate: Secondary | ICD-10-CM | POA: Diagnosis not present

## 2016-10-10 NOTE — Progress Notes (Signed)
Patient: Zachary Medina Male    DOB: 23-Jul-1950   66 y.o.   MRN: [id number] Visit Date: 10/10/2016  Today's Provider: Lelon Huh, MD   Chief Complaint  Patient presents with  . Dizziness  . Palpitations   Subjective:    HPI Pt is here today for palpitations, lightheadedness for about 1-2 weeks. He says that he feels kind of faint during these episodes. He also says that he has had some shortness of breath, slight headache, slight memory impairment, and weakness/fatigue. Denies chest pain, nausea, arm pain or numbness/tingling. Pt reports that these symptoms are more so when he is laying down at night.He reports that he has had more anxiety and under more stress with life lately.      No Known Allergies   Current Outpatient Prescriptions:  .  aspirin 81 MG tablet, Take 81 mg by mouth daily. , Disp: , Rfl:  .  Omega 3 1000 MG CAPS, Take by mouth., Disp: , Rfl:  .  Vitamin D, Cholecalciferol, 1000 units TABS, Take by mouth., Disp: , Rfl:  .  promethazine (PHENERGAN) 25 MG tablet, Take 1 tablet (25 mg total) by mouth every 4 (four) hours as needed for nausea or vomiting. (Patient not taking: Reported on 10/10/2016), Disp: 50 tablet, Rfl: 1 .  ranitidine (ZANTAC) 150 MG tablet, Take 1 tablet (150 mg total) by mouth 2 (two) times daily. (Patient not taking: Reported on 10/10/2016), Disp: 60 tablet, Rfl: 3  Review of Systems  Constitutional: Positive for fatigue.  HENT: Negative.   Eyes: Negative.   Respiratory: Positive for shortness of breath.   Cardiovascular: Positive for palpitations.  Gastrointestinal: Negative.   Endocrine: Negative.   Genitourinary: Negative.   Musculoskeletal: Negative.   Skin: Negative.   Allergic/Immunologic: Negative.   Neurological: Positive for dizziness, weakness, light-headedness and headaches.  Hematological: Negative.   Psychiatric/Behavioral: Negative.     Social History  Substance Use Topics  . Smoking status: Never Smoker  .  Smokeless tobacco: Never Used  . Alcohol use No   Objective:   BP 124/82 (BP Location: Right Arm, Patient Position: Sitting, Cuff Size: Normal)   Pulse 66   Temp 97.7 F (36.5 C) (Oral)   Resp 16   Ht 5\' 8"  (1.727 m)   Wt 172 lb (78 kg)   SpO2 95%   BMI 26.15 kg/m    Physical Exam   General Appearance:    Alert, cooperative, no distress  Eyes:    PERRL, conjunctiva/corneas clear, EOM's intact       Lungs:     Clear to auscultation bilaterally, respirations unlabored  Heart:    Regular rate and rhythm  Neurologic:   Awake, alert, oriented x 3. No apparent focal neurological           defect. Normal cerebellar functions.           Assessment & Plan:     1. Palpitation Suspect ectopic ventricular beats, but not seen on EKG today. Consider holter monitor.  - EKG 12-Lead - T4 AND TSH - Magnesium  2. Dizziness  - Comprehensive metabolic panel - CBC - Troponin I  3. Dyspnea on exertion  - Troponin I - Brain natriuretic peptide  4. Prostate cancer screening  - PSA   The entirety of the information documented in the History of Present Illness, Review of Systems and Physical Exam were personally obtained by me. Portions of this information were initially documented by Webb Laws CMA  and reviewed by me for thoroughness and accuracy.        Lelon Huh, MD  [facility]

## 2016-10-11 LAB — COMPREHENSIVE METABOLIC PANEL
A/G RATIO: 1.8 (ref 1.2–2.2)
ALT: 24 IU/L (ref 0–44)
AST: 24 IU/L (ref 0–40)
Albumin: 4.5 g/dL (ref 3.6–4.8)
Alkaline Phosphatase: 55 IU/L (ref 39–117)
BUN / CREAT RATIO: 15 (ref 10–24)
BUN: 16 mg/dL (ref 8–27)
Bilirubin Total: 2 mg/dL — ABNORMAL HIGH (ref 0.0–1.2)
CALCIUM: 9.5 mg/dL (ref 8.6–10.2)
CO2: 23 mmol/L (ref 20–29)
CREATININE: 1.09 mg/dL (ref 0.76–1.27)
Chloride: 105 mmol/L (ref 96–106)
GFR calc Af Amer: 81 mL/min/{1.73_m2} (ref >59)
GFR, EST NON AFRICAN AMERICAN: 70 mL/min/{1.73_m2} (ref >59)
GLOBULIN, TOTAL: 2.5 g/dL (ref 1.5–4.5)
Glucose: 85 mg/dL (ref 65–99)
Potassium: 4.4 mmol/L (ref 3.5–5.2)
SODIUM: 146 mmol/L — AB (ref 134–144)
Total Protein: 7 g/dL (ref 6.0–8.5)

## 2016-10-11 LAB — CBC
Hematocrit: 47.2 % (ref 37.5–51.0)
Hemoglobin: 16.1 g/dL (ref 13.0–17.7)
MCH: 30.4 pg (ref 26.6–33.0)
MCHC: 34.1 g/dL (ref 31.5–35.7)
MCV: 89 fL (ref 79–97)
PLATELETS: 160 10*3/uL (ref 150–379)
RBC: 5.29 x10E6/uL (ref 4.14–5.80)
RDW: 13.9 % (ref 12.3–15.4)
WBC: 5.1 10*3/uL (ref 3.4–10.8)

## 2016-10-11 LAB — MAGNESIUM: Magnesium: 2.2 mg/dL (ref 1.6–2.3)

## 2016-10-11 LAB — BRAIN NATRIURETIC PEPTIDE: BNP: 21.4 pg/mL (ref 0.0–100.0)

## 2016-10-11 LAB — TROPONIN I: Troponin I: <0.01 ng/mL (ref 0.00–0.04)

## 2016-10-11 LAB — PSA: Prostate Specific Ag, Serum: 3.2 ng/mL (ref 0.0–4.0)

## 2016-10-11 LAB — T4 AND TSH
T4, Total: 6.2 ug/dL (ref 4.5–12.0)
TSH: 2.05 u[IU]/mL (ref 0.450–4.500)

## 2016-10-13 ENCOUNTER — Ambulatory Visit: Payer: 59 | Admitting: Family Medicine

## 2017-04-10 ENCOUNTER — Telehealth: Payer: Self-pay

## 2017-04-10 NOTE — Telephone Encounter (Signed)
FYI Patient called complaining of palpitations that have been going on for several weeks.  He has a history of this for at least a couple of years.  He stated that for several weeks he has had right elbow and forearm soreness.  He denies any shortness of breath, chest pain or other associated symptoms.  He didn't want to go to the ER.  He made an appointment for tomorrow with the understanding that if his symptoms changed or worsened he would go the ER. ED

## 2017-04-11 ENCOUNTER — Ambulatory Visit (INDEPENDENT_AMBULATORY_CARE_PROVIDER_SITE_OTHER): Payer: 59 | Admitting: Family Medicine

## 2017-04-11 VITALS — BP 124/72 | HR 66 | Temp 98.6°F | Resp 14 | Wt 175.2 lb

## 2017-04-11 DIAGNOSIS — Z2821 Immunization not carried out because of patient refusal: Secondary | ICD-10-CM | POA: Diagnosis not present

## 2017-04-11 DIAGNOSIS — R002 Palpitations: Secondary | ICD-10-CM

## 2017-04-11 DIAGNOSIS — K219 Gastro-esophageal reflux disease without esophagitis: Secondary | ICD-10-CM | POA: Diagnosis not present

## 2017-04-11 NOTE — Progress Notes (Signed)
Zachary Medina  MRN: [id number] DOB: February 28, 1951  Subjective:  HPI  Patient is here to discuss palpitations. This has been an issue off and on for years. Last office visit in July 2018 addressed this issue along with EKG done at that time. No further work up was done. Patient states it seems that this comes up when he is under stress like he is now. Palpitations occur when he is lying down whether it is during the day or evening. He is able to fall asleep with palpitations been present and they do not wake up him up at night. No dyspnea or chest tightness or dizziness present with this. He is having some left lower arm mainly tenderness/achy sensation and was having some tightness on the left side of the neck-intermittent, but not right now. Palpitations he is having now due feel similar as he had in July of 2018 when he was seen except that he did not have any arm pain or discomfort.  Patient Active Problem List   Diagnosis Date Noted  . Gastroenteritis 04/26/2016  . Allergic rhinitis 07/16/2014  . Bilirubinemia 07/16/2014  . Acid reflux 07/16/2014  . 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 H/O renal calculi 07/16/2014  . H/O transient cerebral ischemia 07/16/2014  . Bad memory 07/16/2014  . Obstructive apnea 07/16/2014  . HLD (hyperlipidemia) 07/16/2014  . Avitaminosis D 07/16/2014    Past Medical History:  Diagnosis Date  . GERD (gastroesophageal reflux disease)   . Hepatitis   . Hyperlipidemia   . Kidney stones   . TIA (transient ischemic attack)     Social History   Socioeconomic History  . Marital status: Married    Spouse name: Not on file  . Number of children: Not on file  . Years of education: Not on file  . Highest education level: Not on file  Social Needs  . Financial resource strain: Not on file  . Food insecurity - worry: Not on file  . Food insecurity - inability: Not on file  . Transportation needs - medical: Not on file  . Transportation needs -  non-medical: Not on file  Occupational History  . Not on file  Tobacco Use  . Smoking status: Never Smoker  . Smokeless tobacco: Never Used  Substance and Sexual Activity  . Alcohol use: No  . Drug use: No  . Sexual activity: Not on file  Other Topics Concern  . Not on file  Social History Narrative  . Not on file    Outpatient Encounter Medications as of 04/11/2017  Medication Sig Note  . aspirin 81 MG tablet Take 81 mg by mouth daily.  07/21/2014: .   Marland Kitchen Omega 3 1000 MG CAPS Take by mouth.   . Vitamin D, Cholecalciferol, 1000 units TABS Take by mouth.   . [DISCONTINUED] promethazine (PHENERGAN) 25 MG tablet Take 1 tablet (25 mg total) by mouth every 4 (four) hours as needed for nausea or vomiting. (Patient not taking: Reported on 10/10/2016)   . [DISCONTINUED] ranitidine (ZANTAC) 150 MG tablet Take 1 tablet (150 mg total) by mouth 2 (two) times daily. (Patient not taking: Reported on 10/10/2016)    No facility-administered encounter medications on file as of 04/11/2017.     No Known Allergies  Review of Systems  Constitutional: Negative.   Eyes: Negative.   Respiratory: Negative.   Cardiovascular: Positive for palpitations. Negative for chest pain, claudication and leg swelling.  Gastrointestinal: Negative.   Musculoskeletal: Positive for joint pain,  myalgias and neck pain. Negative for falls.  Skin: Negative.   Neurological: Negative.   Endo/Heme/Allergies: Negative.   Psychiatric/Behavioral: Negative.     Objective:  BP 124/72   Pulse 66   Temp 98.6 F (37 C)   Resp 14   Wt 175 lb 3.2 oz (79.5 kg)   SpO2 97%   BMI 26.64 kg/m   Physical Exam  Constitutional: He is oriented to person, place, and time and well-developed, well-nourished, and in no distress.  HENT:  Head: Normocephalic and atraumatic.  Right Ear: External ear normal.  Left Ear: External ear normal.  Nose: Nose normal.  Eyes: Conjunctivae are normal. Pupils are equal, round, and reactive to light. No  scleral icterus.  Neck: No thyromegaly present.  Cardiovascular: Normal rate, regular rhythm, normal heart sounds and intact distal pulses. Exam reveals no gallop.  No murmur heard. Pulmonary/Chest: Effort normal and breath sounds normal. No respiratory distress. He has no wheezes. He has no rales.  Abdominal: Soft.  Musculoskeletal:       Right forearm: He exhibits tenderness.  Lymphadenopathy:    He has no cervical adenopathy.  Neurological: He is alert and oriented to person, place, and time. Gait normal. GCS score is 15.  Skin: Skin is warm and dry.  Psychiatric: Mood, memory, affect and judgment normal.   Assessment and Plan :  1. Palpitations EKG stable. Will go ahead and refer to cardiology-probably will get holter monitor and maybe stress test. Follow as needed.  Patient is doing well today on the exam. - EKG 12-Lead  2. Influenza vaccination declined by patient  HPI, Exam and A&P transcribed by Tiffany Kocher, RMA under direction and in the presence of Miguel Aschoff, MD. I have done the exam and reviewed the chart and it is accurate to the best of my knowledge. Development worker, community has been used and  any errors in dictation or transcription are unintentional. Miguel Aschoff M.D. [facility]

## 2017-04-14 ENCOUNTER — Encounter: Payer: Self-pay | Admitting: Family Medicine

## 2017-05-21 DIAGNOSIS — R002 Palpitations: Secondary | ICD-10-CM | POA: Insufficient documentation

## 2017-05-23 ENCOUNTER — Ambulatory Visit: Payer: 59 | Admitting: Cardiovascular Disease

## 2017-06-12 ENCOUNTER — Ambulatory Visit (INDEPENDENT_AMBULATORY_CARE_PROVIDER_SITE_OTHER): Payer: 59 | Admitting: Family Medicine

## 2017-06-12 ENCOUNTER — Encounter: Payer: Self-pay | Admitting: Family Medicine

## 2017-06-12 VITALS — BP 116/60 | HR 62 | Temp 97.6°F | Resp 18 | Ht 68.0 in | Wt 174.0 lb

## 2017-06-12 DIAGNOSIS — R252 Cramp and spasm: Secondary | ICD-10-CM

## 2017-06-12 DIAGNOSIS — G4733 Obstructive sleep apnea (adult) (pediatric): Secondary | ICD-10-CM | POA: Diagnosis not present

## 2017-06-12 DIAGNOSIS — Z0000 Encounter for general adult medical examination without abnormal findings: Secondary | ICD-10-CM | POA: Diagnosis not present

## 2017-06-12 DIAGNOSIS — Z1211 Encounter for screening for malignant neoplasm of colon: Secondary | ICD-10-CM | POA: Diagnosis not present

## 2017-06-12 DIAGNOSIS — E785 Hyperlipidemia, unspecified: Secondary | ICD-10-CM

## 2017-06-12 DIAGNOSIS — N400 Enlarged prostate without lower urinary tract symptoms: Secondary | ICD-10-CM

## 2017-06-12 DIAGNOSIS — L821 Other seborrheic keratosis: Secondary | ICD-10-CM

## 2017-06-12 LAB — POCT URINALYSIS DIPSTICK
Bilirubin, UA: NEGATIVE
Blood, UA: NEGATIVE
Glucose, UA: NEGATIVE
Ketones, UA: NEGATIVE
LEUKOCYTES UA: NEGATIVE
NITRITE UA: NEGATIVE
PROTEIN UA: NEGATIVE
Spec Grav, UA: 1.02 (ref 1.010–1.025)
Urobilinogen, UA: 0.2 E.U./dL
pH, UA: 6 (ref 5.0–8.0)

## 2017-06-12 LAB — IFOBT (OCCULT BLOOD): IMMUNOLOGICAL FECAL OCCULT BLOOD TEST: NEGATIVE

## 2017-06-12 MED ORDER — MAGNESIUM OXIDE 400 MG PO TABS: 400.0000 mg | ORAL_TABLET | Freq: Every day | ORAL | 11 refills | Status: AC

## 2017-06-12 NOTE — Progress Notes (Signed)
Patient: Zachary Medina, Male    DOB: 06/30/1950, 67 y.o.   MRN: [id number] Visit Date: 06/12/2017  Today's Provider: Wilhemena Durie, MD   Chief Complaint  Patient presents with  . Annual Exam   Subjective:    Annual physical exam Zachary Medina is a 67 y.o. male who presents today for health maintenance and complete physical. He feels well. He reports exercising, walking. He reports he is sleeping fairly well.  ----------------------------------------------------------------- Pt declines pneumonia vaccines.   Colonoscopy- 07/29/14 Dr. Allen Norris 4 polyps, Diverticulosis, hyperplastic polyps. Repeat 10 years    Review of Systems  Constitutional: Negative.   HENT: Negative.   Eyes: Negative.   Respiratory: Negative.   Cardiovascular: Negative.   Gastrointestinal: Negative.   Endocrine: Negative.   Genitourinary: Negative.   Musculoskeletal: Positive for arthralgias.  Skin: Negative.   Allergic/Immunologic: Negative.   Neurological: Negative.   Hematological: Negative.   Psychiatric/Behavioral: Negative.     Social History      He  reports that he has never smoked. He has never used smokeless tobacco. He reports that he does not drink alcohol or use drugs.       Social History   Socioeconomic History  . Marital status: Married    Spouse name: Not on file  . Number of children: Not on file  . Years of education: Not on file  . Highest education level: Not on file  Occupational History  . Not on file  Social Needs  . Financial resource strain: Not on file  . Food insecurity:    Worry: Not on file    Inability: Not on file  . Transportation needs:    Medical: Not on file    Non-medical: Not on file  Tobacco Use  . Smoking status: Never Smoker  . Smokeless tobacco: Never Used  Substance and Sexual Activity  . Alcohol use: No  . Drug use: No  . Sexual activity: Not on file  Lifestyle  . Physical activity:    Days per week: Not on file    Minutes per  session: Not on file  . Stress: Not on file  Relationships  . Social connections:    Talks on phone: Not on file    Gets together: Not on file    Attends religious service: Not on file    Active member of club or organization: Not on file    Attends meetings of clubs or organizations: Not on file    Relationship status: Not on file  Other Topics Concern  . Not on file  Social History Narrative  . Not on file    Past Medical History:  Diagnosis Date  . GERD (gastroesophageal reflux disease)   . Hepatitis   . Hyperlipidemia   . Kidney stones   . TIA (transient ischemic attack)      Patient Active Problem List   Diagnosis Date Noted  . Palpitations 05/21/2017  . Gastroenteritis 04/26/2016  . Allergic rhinitis 07/16/2014  . Acid reflux 07/16/2014  . 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 H/O renal calculi 07/16/2014  . H/O transient cerebral ischemia 07/16/2014  . Obstructive apnea 07/16/2014  . HLD (hyperlipidemia) 07/16/2014  . Avitaminosis D 07/16/2014    Past Surgical History:  Procedure Laterality Date  . CHOLECYSTECTOMY    . COLONOSCOPY N/A 07/29/2014   Procedure: COLONOSCOPY;  Surgeon: Lucilla Lame, MD;  Location: [facility] ENDOSCOPY;  Service: Endoscopy;  Laterality: N/A;  . HERNIA REPAIR  Family History        Family Status  Relation Name Status  . Mother  Alive  . Father  Deceased  . Brother  Alive  . Brother  Alive        His family history includes Arthritis in his father and mother; Congestive Heart Failure in his father; Diabetes in his brother; Glaucoma in his mother; Heart disease in his father; Parkinson's disease in his father; Prostate cancer in his father.      No Known Allergies   Current Outpatient Medications:  .  aspirin 81 MG tablet, Take 81 mg by mouth daily. , Disp: , Rfl:  .  Omega 3 1000 MG CAPS, Take by mouth., Disp: , Rfl:  .  Vitamin D, Cholecalciferol, 1000 units TABS, Take by mouth., Disp: , Rfl:    Patient Care  Team: Jerrol Banana., MD as PCP - General (Family Medicine)      Objective:   Vitals: BP 116/60 (BP Location: Left Arm, Patient Position: Sitting, Cuff Size: Normal)   Pulse 62   Temp 97.6 F (36.4 C) (Oral)   Resp 18   Ht 5\' 8"  (1.727 m)   Wt 174 lb (78.9 kg)   BMI 26.46 kg/m    Vitals:   06/12/17 1426  BP: 116/60  Pulse: 62  Resp: 18  Temp: 97.6 F (36.4 C)  TempSrc: Oral  Weight: 174 lb (78.9 kg)  Height: 5\' 8"  (1.727 m)     Physical Exam  Constitutional: He is oriented to person, place, and time. He appears well-developed and well-nourished.  HENT:  Head: Normocephalic and atraumatic.  Right Ear: External ear normal.  Left Ear: External ear normal.  Nose: Nose normal.  Mouth/Throat: Oropharynx is clear and moist.  Eyes: Conjunctivae are normal. No scleral icterus.  Neck: No thyromegaly present.  Cardiovascular: Normal rate, regular rhythm and normal heart sounds.  Pulmonary/Chest: Effort normal and breath sounds normal.  Abdominal: Soft.  Genitourinary: Rectum normal, prostate normal and penis normal.  Musculoskeletal: Normal range of motion.  Neurological: He is alert and oriented to person, place, and time.  Skin: Skin is warm and dry.  Multiple large seborrheic keratosis.  The ones around his neck appear to be very irritated and some are  even bleeding.  The skin on his trunk is also extremely dry.  Psychiatric: He has a normal mood and affect. His behavior is normal. Judgment and thought content normal.     Depression Screen PHQ 2/9 Scores 06/12/2017 10/10/2016 07/01/2015  PHQ - 2 Score 1 1 0      Assessment & Plan:     Routine Health Maintenance and Physical Exam  Exercise Activities and Dietary recommendations Goals    None      Immunization History  Administered Date(s) Administered  . Tdap 02/12/2010    Health Maintenance  Topic Date Due  . Hepatitis C Screening  [birth year]  . PNA vac Low Risk Adult (1 of 2 - PCV13)  03/18/2015  . INFLUENZA VACCINE  10/12/2017  . TETANUS/TDAP  02/13/2020  . COLONOSCOPY  07/28/2024     Discussed health benefits of physical activity, and encouraged him to engage in regular exercise appropriate for his age and condition. Irritated SKs Refer to Derm.  OSA Pt trying to manage at home.   --------------------------------------------------------------------   I have done the exam and reviewed the above chart and it is accurate to the best of my knowledge. Development worker, community has been used in  this note in any air is in the dictation or transcription are unintentional.  Wilhemena Durie, MD  [facility]

## 2017-06-15 ENCOUNTER — Telehealth: Payer: Self-pay

## 2017-06-15 LAB — CBC WITH DIFFERENTIAL/PLATELET
BASOS ABS: 0 10*3/uL (ref 0.0–0.2)
Basos: 0 %
EOS (ABSOLUTE): 0.2 10*3/uL (ref 0.0–0.4)
Eos: 3 %
HEMOGLOBIN: 16 g/dL (ref 13.0–17.7)
Hematocrit: 47 % (ref 37.5–51.0)
Immature Grans (Abs): 0 10*3/uL (ref 0.0–0.1)
Immature Granulocytes: 0 %
LYMPHS ABS: 1.1 10*3/uL (ref 0.7–3.1)
LYMPHS: 23 %
MCH: 30.5 pg (ref 26.6–33.0)
MCHC: 34 g/dL (ref 31.5–35.7)
MCV: 90 fL (ref 79–97)
MONOCYTES: 12 %
Monocytes Absolute: 0.5 10*3/uL (ref 0.1–0.9)
Neutrophils Absolute: 2.8 10*3/uL (ref 1.4–7.0)
Neutrophils: 62 %
PLATELETS: 127 10*3/uL — AB (ref 150–379)
RBC: 5.24 x10E6/uL (ref 4.14–5.80)
RDW: 13.7 % (ref 12.3–15.4)
WBC: 4.6 10*3/uL (ref 3.4–10.8)

## 2017-06-15 LAB — COMPREHENSIVE METABOLIC PANEL
ALBUMIN: 4.5 g/dL (ref 3.6–4.8)
ALT: 24 IU/L (ref 0–44)
AST: 22 IU/L (ref 0–40)
Albumin/Globulin Ratio: 2.3 — ABNORMAL HIGH (ref 1.2–2.2)
Alkaline Phosphatase: 60 IU/L (ref 39–117)
BILIRUBIN TOTAL: 2.2 mg/dL — AB (ref 0.0–1.2)
BUN / CREAT RATIO: 14 (ref 10–24)
BUN: 17 mg/dL (ref 8–27)
CALCIUM: 9.4 mg/dL (ref 8.6–10.2)
CHLORIDE: 103 mmol/L (ref 96–106)
CO2: 26 mmol/L (ref 20–29)
CREATININE: 1.2 mg/dL (ref 0.76–1.27)
GFR, EST AFRICAN AMERICAN: 72 mL/min/{1.73_m2} (ref >59)
GFR, EST NON AFRICAN AMERICAN: 62 mL/min/{1.73_m2} (ref >59)
GLUCOSE: 96 mg/dL (ref 65–99)
Globulin, Total: 2 g/dL (ref 1.5–4.5)
Potassium: 4.2 mmol/L (ref 3.5–5.2)
Sodium: 142 mmol/L (ref 134–144)
TOTAL PROTEIN: 6.5 g/dL (ref 6.0–8.5)

## 2017-06-15 LAB — LIPID PANEL
CHOLESTEROL TOTAL: 193 mg/dL (ref 100–199)
Chol/HDL Ratio: 5.5 ratio — ABNORMAL HIGH (ref 0.0–5.0)
HDL: 35 mg/dL — ABNORMAL LOW (ref >39)
LDL CALC: 135 mg/dL — AB (ref 0–99)
TRIGLYCERIDES: 113 mg/dL (ref 0–149)
VLDL Cholesterol Cal: 23 mg/dL (ref 5–40)

## 2017-06-15 LAB — TSH: TSH: 2.15 u[IU]/mL (ref 0.450–4.500)

## 2017-06-15 LAB — PSA: PROSTATE SPECIFIC AG, SERUM: 3 ng/mL (ref 0.0–4.0)

## 2017-06-15 NOTE — Telephone Encounter (Signed)
Tried calling patient, no answer and VMbox full. Will try again later.

## 2017-06-15 NOTE — Telephone Encounter (Signed)
-----   Message from Jerrol Banana., MD sent at 06/15/2017  8:48 AM EDT ----- Labs stable.

## 2017-06-15 NOTE — Telephone Encounter (Signed)
Patient advised. Lab results printed at the front desk for pick up per patient's request.  

## 2017-07-09 NOTE — Progress Notes (Signed)
Cardiology Office Note  Date:  07/11/2017   ID:  Zachary Medina, Zachary Medina 1950/07/26, MRN [id number]  PCP:  Jerrol Banana., MD   Chief Complaint  Patient presents with  . other    Heart palpitations. Meds reviewed verbally with pt.    HPI:  Mr. Zachary Medina is a 67 year old gentleman with past medical history of GERD, kidney stones, hyperlipidemia, TIA Who presents by referral from Dr. Rosanna Randy for consultation of his palpitations  Reports he has had long history of palpitations, more noticeable recently Typically will notice them when he puts his hands up to his neck or lays his head down on his pillow or armrest on a couch Does not appreciate palpitations during the daytime Does not feel it is a strong beat, only a dropped beat It will go around 8 beats and then will drop 1 beat Symptoms during day and night Similar significant symptoms July 2018  Denies any shortness of breath or chest discomfort on exertion Reports having some stress at home  On discussing his family history he reports Father with open heart surgery, but details unclear Both grandfathers with cardiac disease   EKG personally reviewed by myself on todays visit Shows sinus bradycardia rate 53 bpm no significant ST or T wave changes   PMH:   has a past medical history of GERD (gastroesophageal reflux disease), Hepatitis, Hyperlipidemia, Kidney stones, and TIA (transient ischemic attack).  PSH:    Past Surgical History:  Procedure Laterality Date  . CHOLECYSTECTOMY    . COLONOSCOPY N/A 07/29/2014   Procedure: COLONOSCOPY;  Surgeon: Lucilla Lame, MD;  Location: [facility] ENDOSCOPY;  Service: Endoscopy;  Laterality: N/A;  . HERNIA REPAIR      Current Outpatient Medications  Medication Sig Dispense Refill  . aspirin 81 MG tablet Take 81 mg by mouth daily.     . magnesium oxide (MAG-OX) 400 MG tablet Take 1 tablet (400 mg total) by mouth at bedtime. 30 tablet 11  . Omega 3 1000 MG CAPS Take by mouth.    .  Vitamin D, Cholecalciferol, 1000 units TABS Take by mouth.     No current facility-administered medications for this visit.      Allergies:   Patient has no known allergies.   Social History:  The patient  reports that he has never smoked. He has never used smokeless tobacco. He reports that he does not drink alcohol or use drugs.   Family History:   family history includes Arthritis in his father and mother; Congestive Heart Failure in his father; Diabetes in his brother; Glaucoma in his mother; Heart disease in his father; Parkinson's disease in his father; Prostate cancer in his father.    Review of Systems: Review of Systems  Constitutional: Negative.   Respiratory: Negative.   Cardiovascular: Positive for palpitations.       Irregular Heart rhythm  Gastrointestinal: Negative.   Musculoskeletal: Negative.   Neurological: Negative.   Psychiatric/Behavioral: Negative.   All other systems reviewed and are negative.    PHYSICAL EXAM: VS:  BP 135/82 (BP Location: Right Arm, Patient Position: Sitting, Cuff Size: Normal)   Pulse (!) 53   Ht 5\' 8"  (1.727 m)   Wt 175 lb 8 oz (79.6 kg)   BMI 26.68 kg/m  , BMI Body mass index is 26.68 kg/m. GEN: Well nourished, well developed, in no acute distress  HEENT: normal  Neck: no JVD, carotid bruits, or masses Cardiac: RRR; no murmurs, rubs, or gallops,no edema  Respiratory:  clear to auscultation bilaterally, normal work of breathing GI: soft, nontender, nondistended, + BS MS: no deformity or atrophy  Skin: warm and dry, no rash Neuro:  Strength and sensation are intact Psych: euthymic mood, full affect    Recent Labs: 10/10/2016: BNP 21.4; Magnesium 2.2 06/14/2017: ALT 24; BUN 17; Creatinine, Ser 1.20; Hemoglobin 16.0; Platelets 127; Potassium 4.2; Sodium 142; TSH 2.150    Lipid Panel Lab Results  Component Value Date   CHOL 193 06/14/2017   HDL 35 (L) 06/14/2017   LDLCALC 135 (H) 06/14/2017   TRIG 113 06/14/2017       Wt Readings from Last 3 Encounters:  07/11/17 175 lb 8 oz (79.6 kg)  06/12/17 174 lb (78.9 kg)  04/11/17 175 lb 3.2 oz (79.5 kg)       ASSESSMENT AND PLAN:  Irregular heartbeat Palpitations -  PVCs noted on EKG January 2019 Frequent ectopy on exam today Event monitor has been ordered to determine burden of his PVCs, Rule out other arrhythmia especially in light of prior history of TIA  PVCs Long discussion with him today concerning mechanism Given normal EKG, normal clinical exam, we have not ordered echocardiogram at this time PVCs may be difficult to treat given his underlying bradycardia  H/O transient cerebral ischemia Details unclear Monitor as above  Mixed hyperlipidemia Long discussion with him concerning screening opportunities for coronary disease given his family history.  After further discussion we have ordered CT coronary calcium scoring  Disposition:   F/U as needed  We will call him with the results of the testing above including 2-week event monitor and CT coronary calcium scoring   Total encounter time more than 45 minutes  Greater than 50% was spent in counseling and coordination of care with the patient   Orders Placed This Encounter  Procedures  . EKG 12-Lead     Signed, Esmond Plants, M.D., Ph.D. 07/11/2017  [place], [place]

## 2017-07-11 ENCOUNTER — Ambulatory Visit (INDEPENDENT_AMBULATORY_CARE_PROVIDER_SITE_OTHER): Payer: 59 | Admitting: Cardiovascular Disease

## 2017-07-11 ENCOUNTER — Ambulatory Visit (INDEPENDENT_AMBULATORY_CARE_PROVIDER_SITE_OTHER): Payer: 59

## 2017-07-11 ENCOUNTER — Encounter: Payer: Self-pay | Admitting: Cardiovascular Disease

## 2017-07-11 VITALS — BP 135/82 | HR 53 | Ht 68.0 in | Wt 175.5 lb

## 2017-07-11 DIAGNOSIS — E782 Mixed hyperlipidemia: Secondary | ICD-10-CM | POA: Diagnosis not present

## 2017-07-11 DIAGNOSIS — I499 Cardiac arrhythmia, unspecified: Secondary | ICD-10-CM | POA: Diagnosis not present

## 2017-07-11 DIAGNOSIS — R002 Palpitations: Secondary | ICD-10-CM | POA: Diagnosis not present

## 2017-07-11 DIAGNOSIS — I493 Ventricular premature depolarization: Secondary | ICD-10-CM

## 2017-07-11 DIAGNOSIS — Z8673 Personal history of transient ischemic attack (TIA), and cerebral infarction without residual deficits: Secondary | ICD-10-CM | POA: Diagnosis not present

## 2017-07-11 NOTE — Patient Instructions (Addendum)
You are having PVCs Premature ventricular contraction   Medication Instructions:   No medication changes made  Labwork:  No new labs needed  Testing/Procedures:  We will order a 2 week monitor for PVCs, irregular rhythm  Please call if you would like a CT coronary calcium score  $150   Follow-Up: It was a pleasure seeing you in the office today. Please call us if you have new issues that need to be addressed before your next appt.  [phone]  Your physician wants you to follow-up in: As needed  If you need a refill on your cardiac medications before your next appointment, please call your pharmacy.  For educational health videos Log in to : [email or web address] Or : [email or web address], password : [email or web address]

## 2017-07-13 ENCOUNTER — Telehealth: Payer: Self-pay | Admitting: Family Medicine

## 2017-07-13 DIAGNOSIS — L821 Other seborrheic keratosis: Secondary | ICD-10-CM

## 2017-07-13 NOTE — Telephone Encounter (Signed)
ok 

## 2017-07-13 NOTE — Telephone Encounter (Signed)
Referral placed Pt advised 

## 2017-07-13 NOTE — Telephone Encounter (Signed)
Pt called stating that he was diagnosed with melanoma by Dr Evorn Gong and would like 2nd opinion.He would like to be referred to Dr Lanae Crumbly at [facility]

## 2017-12-18 ENCOUNTER — Ambulatory Visit (INDEPENDENT_AMBULATORY_CARE_PROVIDER_SITE_OTHER): Payer: 59 | Admitting: Family Medicine

## 2017-12-18 ENCOUNTER — Encounter: Payer: Self-pay | Admitting: Family Medicine

## 2017-12-18 VITALS — BP 112/64 | HR 62 | Temp 98.7°F | Resp 16 | Ht 68.0 in | Wt 172.0 lb

## 2017-12-18 DIAGNOSIS — Z23 Encounter for immunization: Secondary | ICD-10-CM | POA: Diagnosis not present

## 2017-12-18 DIAGNOSIS — G4733 Obstructive sleep apnea (adult) (pediatric): Secondary | ICD-10-CM

## 2017-12-18 DIAGNOSIS — K219 Gastro-esophageal reflux disease without esophagitis: Secondary | ICD-10-CM

## 2017-12-18 NOTE — Progress Notes (Signed)
       Patient: Zachary Medina Male    DOB: 1950/03/24   67 y.o.   MRN: [id number] Visit Date: 12/18/2017  Today's Provider: Wilhemena Durie, MD   Chief Complaint  Patient presents with  . Follow-up   Subjective:    HPI Patient comes in today for a 6 month follow up. No changes were made in his medications since last visit. He feels well today with no complaints. He has not had any palpitations since he was seen by cardiology 6 months ago. He is wanting to start back on his CPAP machine, but feels that he may need a sleep study done due to it being almost 10 years since his last one.     He has intentionally lost 4 lbs since his last visit.  No Known Allergies   Current Outpatient Medications:  .  aspirin 81 MG tablet, Take 81 mg by mouth daily. , Disp: , Rfl:  .  Omega 3 1000 MG CAPS, Take by mouth., Disp: , Rfl:  .  Vitamin D, Cholecalciferol, 1000 units TABS, Take by mouth., Disp: , Rfl:  .  magnesium oxide (MAG-OX) 400 MG tablet, Take 1 tablet (400 mg total) by mouth at bedtime. (Patient not taking: Reported on 12/18/2017), Disp: 30 tablet, Rfl: 11  Review of Systems  Constitutional: Negative.   HENT: Negative.   Eyes: Negative.   Respiratory: Negative.   Cardiovascular: Negative.   Gastrointestinal: Negative.   Endocrine: Negative.   Musculoskeletal: Negative.   Skin: Negative.   Allergic/Immunologic: Negative.   Neurological: Negative.   Psychiatric/Behavioral: Negative.     Social History   Tobacco Use  . Smoking status: Never Smoker  . Smokeless tobacco: Never Used  Substance Use Topics  . Alcohol use: No   Objective:   BP 112/64 (BP Location: Left Arm, Patient Position: Sitting, Cuff Size: Normal)   Pulse 62   Temp 98.7 F (37.1 C)   Resp 16   SpO2 96%  Vitals:   12/18/17 1440  BP: 112/64  Pulse: 62  Resp: 16  Temp: 98.7 F (37.1 C)  SpO2: 96%     Physical Exam  Constitutional: He is oriented to person, place, and time. He appears  well-developed and well-nourished.  HENT:  Head: Normocephalic and atraumatic.  Right Ear: External ear normal.  Left Ear: External ear normal.  Nose: Nose normal.  Eyes: Conjunctivae are normal. No scleral icterus.  Neck: No thyromegaly present.  Cardiovascular: Normal rate, regular rhythm and normal heart sounds.  Pulmonary/Chest: Effort normal and breath sounds normal.  Abdominal: Soft.  Musculoskeletal: He exhibits no edema.  Neurological: He is alert and oriented to person, place, and time.  Skin: Skin is warm and dry.  Psychiatric: He has a normal mood and affect. His behavior is normal. Judgment and thought content normal.        Assessment & Plan:     1. Gastroesophageal reflux disease, esophagitis presence not specified   2. OSA (obstructive sleep apnea)  - Home sleep test  3. Need for pneumococcal vaccine  - Pneumococcal conjugate vaccine 13-valent IM      I have done the exam and reviewed the above chart and it is accurate to the best of my knowledge. Development worker, community has been used in this note in any air is in the dictation or transcription are unintentional.  Wilhemena Durie, MD  [place]

## 2017-12-27 ENCOUNTER — Telehealth: Payer: Self-pay | Admitting: Family Medicine

## 2017-12-27 NOTE — Telephone Encounter (Signed)
Order for home sleep study faxed to [facility]

## 2018-01-16 ENCOUNTER — Other Ambulatory Visit: Payer: Self-pay

## 2018-01-16 DIAGNOSIS — G4733 Obstructive sleep apnea (adult) (pediatric): Secondary | ICD-10-CM

## 2018-01-16 NOTE — Progress Notes (Signed)
CPAP order

## 2018-06-18 ENCOUNTER — Encounter: Payer: Self-pay | Admitting: Family Medicine

## 2018-10-30 DIAGNOSIS — G4733 Obstructive sleep apnea (adult) (pediatric): Secondary | ICD-10-CM | POA: Diagnosis not present

## 2018-11-21 ENCOUNTER — Ambulatory Visit (INDEPENDENT_AMBULATORY_CARE_PROVIDER_SITE_OTHER): Payer: Medicare HMO | Admitting: Family Medicine

## 2018-11-21 ENCOUNTER — Other Ambulatory Visit: Payer: Self-pay

## 2018-11-21 VITALS — BP 122/78 | HR 52 | Temp 98.3°F | Resp 16 | Wt 175.8 lb

## 2018-11-21 DIAGNOSIS — Z0000 Encounter for general adult medical examination without abnormal findings: Secondary | ICD-10-CM | POA: Diagnosis not present

## 2018-11-21 DIAGNOSIS — E785 Hyperlipidemia, unspecified: Secondary | ICD-10-CM | POA: Diagnosis not present

## 2018-11-21 DIAGNOSIS — Z1211 Encounter for screening for malignant neoplasm of colon: Secondary | ICD-10-CM

## 2018-11-21 DIAGNOSIS — N400 Enlarged prostate without lower urinary tract symptoms: Secondary | ICD-10-CM | POA: Diagnosis not present

## 2018-11-21 LAB — POCT URINALYSIS DIPSTICK
Bilirubin, UA: NEGATIVE
Blood, UA: NEGATIVE
Glucose, UA: NEGATIVE
Ketones, UA: NEGATIVE
Leukocytes, UA: NEGATIVE
Nitrite, UA: NEGATIVE
Protein, UA: NEGATIVE
Spec Grav, UA: 1.015 (ref 1.010–1.025)
Urobilinogen, UA: 0.2 E.U./dL
pH, UA: 5 (ref 5.0–8.0)

## 2018-11-21 NOTE — Progress Notes (Signed)
Patient: Zachary Medina, Male    DOB: April 02, 1950, 68 y.o.   MRN: [id number] Visit Date: 11/21/2018  Today's Provider: Wilhemena Durie, MD   Chief Complaint  Patient presents with  . Annual Exam   Subjective:     [place] to Medicare exam Zachary Medina is a 68 y.o. male who presents today for health maintenance and complete physical. He feels well. He reports exercising occasionally. He reports he is sleeping well. 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  Colonoscopy- 07/29/2014. Dr. Allen Norris. Repeat in 5 years.     Review of Systems  Constitutional: Negative.   HENT: Negative.   Eyes: Negative.   Respiratory: Negative.   Gastrointestinal: Negative.   Endocrine: Negative.   Genitourinary: Negative.   Musculoskeletal: Positive for arthralgias.       Recently tender over both hips.Resolved.  Skin: Negative.   Allergic/Immunologic: Negative.   Neurological: Negative.   Hematological: Negative.   Psychiatric/Behavioral: Negative.     Social History      He  reports that he has never smoked. He has never used smokeless tobacco. He reports that he does not drink alcohol or use drugs.       Social History   Socioeconomic History  . Marital status: Married    Spouse name: Not on file  . Number of children: Not on file  . Years of education: Not on file  . Highest education level: Not on file  Occupational History  . Not on file  Social Needs  . Financial resource strain: Not on file  . Food insecurity    Worry: Not on file    Inability: Not on file  . Transportation needs    Medical: Not on file    Non-medical: Not on file  Tobacco Use  . Smoking status: Never Smoker  . Smokeless tobacco: Never Used  Substance and Sexual Activity  . Alcohol use: No  . Drug use: No  . Sexual activity: Not on file  Lifestyle  . Physical activity    Days per week: Not on file    Minutes per session: Not on file  . Stress: Not on file  Relationships  . Social  Herbalist on phone: Not on file    Gets together: Not on file    Attends religious service: Not on file    Active member of club or organization: Not on file    Attends meetings of clubs or organizations: Not on file    Relationship status: Not on file  Other Topics Concern  . Not on file  Social History Narrative  . Not on file    Past Medical History:  Diagnosis Date  . GERD (gastroesophageal reflux disease)   . Hepatitis   . Hyperlipidemia   . Kidney stones   . TIA (transient ischemic attack)      Patient Active Problem List   Diagnosis Date Noted  . Irregular heartbeat 07/11/2017  . Palpitations 05/21/2017  . Gastroenteritis 04/26/2016  . Allergic rhinitis 07/16/2014  . Acid reflux 07/16/2014  . 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 H/O renal calculi 07/16/2014  . H/O transient cerebral ischemia 07/16/2014  . Obstructive apnea 07/16/2014  . HLD (hyperlipidemia) 07/16/2014  . Avitaminosis D 07/16/2014    Past Surgical History:  Procedure Laterality Date  . CHOLECYSTECTOMY    . COLONOSCOPY N/A 07/29/2014   Procedure: COLONOSCOPY;  Surgeon: Lucilla Lame, MD;  Location: [facility] ENDOSCOPY;  Service: Endoscopy;  Laterality: N/A;  . HERNIA REPAIR      Family History        Family Status  Relation Name Status  . Mother  Alive  . Father  Deceased  . Brother  Alive  . Brother  Alive        His family history includes Arthritis in his father and mother; Congestive Heart Failure in his father; Diabetes in his brother; Glaucoma in his mother; Heart disease in his father; Parkinson's disease in his father; Prostate cancer in his father.      No Known Allergies   Current Outpatient Medications:  .  aspirin 81 MG tablet, Take 81 mg by mouth daily. , Disp: , Rfl:  .  magnesium oxide (MAG-OX) 400 MG tablet, Take 1 tablet (400 mg total) by mouth at bedtime. (Patient not taking: Reported on 12/18/2017), Disp: 30 tablet, Rfl: 11 .  Omega 3 1000 MG CAPS, Take by  mouth., Disp: , Rfl:  .  Vitamin D, Cholecalciferol, 1000 units TABS, Take by mouth., Disp: , Rfl:    Patient Care Team: Jerrol Banana., MD as PCP - General (Family Medicine)    Objective:    Vitals: BP 122/78   Pulse (!) 52   Temp 98.3 F (36.8 C)   Resp 16   Wt 175 lb 12.8 oz (79.7 kg)   SpO2 95%   BMI 26.73 kg/m    Vitals:   11/21/18 1014  BP: 122/78  Pulse: (!) 52  Resp: 16  Temp: 98.3 F (36.8 C)  SpO2: 95%  Weight: 175 lb 12.8 oz (79.7 kg)     Physical Exam Vitals signs reviewed.  Constitutional:      Appearance: Normal appearance. He is well-developed.  HENT:     Head: Normocephalic and atraumatic.     Right Ear: External ear normal.     Left Ear: External ear normal.     Nose: Nose normal.  Eyes:     General: No scleral icterus.    Conjunctiva/sclera: Conjunctivae normal.  Neck:     Thyroid: No thyromegaly.  Cardiovascular:     Rate and Rhythm: Normal rate and regular rhythm.     Heart sounds: Normal heart sounds.  Pulmonary:     Effort: Pulmonary effort is normal.     Breath sounds: Normal breath sounds.  Abdominal:     Palpations: Abdomen is soft.  Musculoskeletal:     Right lower leg: No edema.     Left lower leg: No edema.  Skin:    General: Skin is warm and dry.     Comments: Multiple SKs.Left plantar wart.  Neurological:     General: No focal deficit present.     Mental Status: He is alert and oriented to person, place, and time.  Psychiatric:        Mood and Affect: Mood normal.        Behavior: Behavior normal.        Thought Content: Thought content normal.        Judgment: Judgment normal.   ECG Sinus brady   Depression Screen PHQ 2/9 Scores 11/21/2018 06/12/2017 10/10/2016 07/01/2015  PHQ - 2 Score 1 1 1  0  PHQ- 9 Score 2 - - -       Assessment & Plan:     Welcome to Medicare  Exam  Exercise Activities and Dietary recommendations Goals   None     Immunization History  Administered Date(s) Administered  .  Pneumococcal Conjugate-13 12/18/2017  . Td 06/14/1999  . Tdap 02/12/2010    Health Maintenance  Topic Date Due  . Hepatitis C Screening  09/14/1950  . INFLUENZA VACCINE  10/13/2018  . PNA vac Low Risk Adult (2 of 2 - PPSV23) 12/19/2018  . TETANUS/TDAP  02/13/2020  . COLONOSCOPY  07/28/2024     Discussed health benefits of physical activity, and encouraged him to engage in regular exercise appropriate for his age and condition.    --------------------------------------------------------------------   I have done the exam and reviewed the above chart and it is accurate to the best of my knowledge. Development worker, community has been used in this note in any air is in the dictation or transcription are unintentional.  Wilhemena Durie, MD  [place]

## 2018-12-13 ENCOUNTER — Encounter: Payer: Self-pay | Admitting: Family Medicine

## 2018-12-13 ENCOUNTER — Other Ambulatory Visit: Payer: Self-pay

## 2018-12-13 ENCOUNTER — Ambulatory Visit (INDEPENDENT_AMBULATORY_CARE_PROVIDER_SITE_OTHER): Payer: Medicare HMO | Admitting: Family Medicine

## 2018-12-13 VITALS — BP 142/78 | HR 53 | Temp 97.1°F | Resp 18 | Wt 173.6 lb

## 2018-12-13 DIAGNOSIS — N400 Enlarged prostate without lower urinary tract symptoms: Secondary | ICD-10-CM

## 2018-12-13 DIAGNOSIS — I10 Essential (primary) hypertension: Secondary | ICD-10-CM

## 2018-12-13 DIAGNOSIS — E785 Hyperlipidemia, unspecified: Secondary | ICD-10-CM

## 2018-12-13 DIAGNOSIS — R002 Palpitations: Secondary | ICD-10-CM

## 2018-12-13 NOTE — Progress Notes (Signed)
Patient: Zachary Medina Male    DOB: 08-01-1950   68 y.o.   MRN: [id number] Visit Date: 12/13/2018  Today's Provider: Wilhemena Durie, MD   Chief Complaint  Patient presents with  . Follow-up   Subjective:     HPI 3 week follow up and lab work.  No Known Allergies   Current Outpatient Medications:  .  aspirin 81 MG tablet, Take 81 mg by mouth daily. , Disp: , Rfl:  .  magnesium oxide (MAG-OX) 400 MG tablet, Take 1 tablet (400 mg total) by mouth at bedtime., Disp: 30 tablet, Rfl: 11 .  Omega 3 1000 MG CAPS, Take by mouth., Disp: , Rfl:  .  Vitamin D, Cholecalciferol, 1000 units TABS, Take by mouth., Disp: , Rfl:   Review of Systems  Constitutional: Negative.   HENT: Negative.   Eyes: Negative.   Respiratory: Negative.   Gastrointestinal: Negative.   Endocrine: Negative.   Genitourinary: Negative.   Musculoskeletal: Positive for arthralgias.       Recently tender over both hips.Resolved.  Skin: Negative.   Allergic/Immunologic: Negative.   Neurological: Negative.   Hematological: Negative.   Psychiatric/Behavioral: Negative.     Social History   Tobacco Use  . Smoking status: Never Smoker  . Smokeless tobacco: Never Used  Substance Use Topics  . Alcohol use: No      Objective:   BP (!) 142/78 (BP Location: Right Arm, Patient Position: Sitting, Cuff Size: Normal)   Pulse (!) 53   Temp (!) 97.1 F (36.2 C) (Temporal)   Resp 18   Wt 173 lb 9.6 oz (78.7 kg)   SpO2 99%   BMI 26.40 kg/m  Vitals:   12/13/18 0839  BP: (!) 142/78  Pulse: (!) 53  Resp: 18  Temp: (!) 97.1 F (36.2 C)  TempSrc: Temporal  SpO2: 99%  Weight: 173 lb 9.6 oz (78.7 kg)  Body mass index is 26.4 kg/m.   Physical Exam Vitals signs reviewed.  Constitutional:      Appearance: He is well-developed.  HENT:     Head: Normocephalic and atraumatic.     Right Ear: External ear normal.     Left Ear: External ear normal.     Nose: Nose normal.  Eyes:     General: No  scleral icterus.    Conjunctiva/sclera: Conjunctivae normal.  Neck:     Thyroid: No thyromegaly.  Cardiovascular:     Rate and Rhythm: Normal rate and regular rhythm.     Heart sounds: Normal heart sounds.  Pulmonary:     Effort: Pulmonary effort is normal.     Breath sounds: Normal breath sounds.  Abdominal:     Palpations: Abdomen is soft.  Skin:    General: Skin is warm and dry.  Neurological:     Mental Status: He is alert and oriented to person, place, and time.  Psychiatric:        Behavior: Behavior normal.        Thought Content: Thought content normal.        Judgment: Judgment normal.      No results found for any visits on 12/13/18.     Assessment & Plan    1. Hyperlipidemia, unspecified hyperlipidemia type  - Lipid Profile - CBC w/Diff/Platelet  2. Benign prostatic hyperplasia, unspecified whether lower urinary tract symptoms present  - PSA  3. Palpitations  - TSH - CBC w/Diff/Platelet  4. Essential hypertension Presently controlled.  May need  ARB  in the near future or beta-blocker/Diltiazem - Comp. Metabolic Panel (12) - TSH - CBC w/Diff/Platelet     Wilhemena Durie, MD  [facility]

## 2018-12-13 NOTE — Progress Notes (Signed)
       Patient: Zachary Medina Male    DOB: 10/24/1950   68 y.o.   MRN: [id number] Visit Date: 12/13/2018  Today's Provider: Wilhemena Durie, MD   Chief Complaint  Patient presents with  . Follow-up   Subjective:     HPI 3 week follow up for lab work.   No Known Allergies   Current Outpatient Medications:  .  aspirin 81 MG tablet, Take 81 mg by mouth daily. , Disp: , Rfl:  .  magnesium oxide (MAG-OX) 400 MG tablet, Take 1 tablet (400 mg total) by mouth at bedtime., Disp: 30 tablet, Rfl: 11 .  Omega 3 1000 MG CAPS, Take by mouth., Disp: , Rfl:  .  Vitamin D, Cholecalciferol, 1000 units TABS, Take by mouth., Disp: , Rfl:   Review of Systems  Social History   Tobacco Use  . Smoking status: Never Smoker  . Smokeless tobacco: Never Used  Substance Use Topics  . Alcohol use: No      Objective:   There were no vitals taken for this visit. There were no vitals filed for this visit.There is no height or weight on file to calculate BMI.   Physical Exam   No results found for any visits on 12/13/18.     Assessment & Plan        Wilhemena Durie, MD  [facility]

## 2018-12-14 LAB — CBC WITH DIFFERENTIAL/PLATELET
Basophils Absolute: 0 10*3/uL (ref 0.0–0.2)
Basos: 1 %
EOS (ABSOLUTE): 0.2 10*3/uL (ref 0.0–0.4)
Eos: 4 %
Hematocrit: 48.8 % (ref 37.5–51.0)
Hemoglobin: 16.7 g/dL (ref 13.0–17.7)
Immature Grans (Abs): 0 10*3/uL (ref 0.0–0.1)
Immature Granulocytes: 0 %
Lymphocytes Absolute: 1.1 10*3/uL (ref 0.7–3.1)
Lymphs: 24 %
MCH: 31.3 pg (ref 26.6–33.0)
MCHC: 34.2 g/dL (ref 31.5–35.7)
MCV: 91 fL (ref 79–97)
Monocytes Absolute: 0.5 10*3/uL (ref 0.1–0.9)
Monocytes: 11 %
Neutrophils Absolute: 2.7 10*3/uL (ref 1.4–7.0)
Neutrophils: 60 %
Platelets: 145 10*3/uL — ABNORMAL LOW (ref 150–450)
RBC: 5.34 x10E6/uL (ref 4.14–5.80)
RDW: 13.1 % (ref 11.6–15.4)
WBC: 4.5 10*3/uL (ref 3.4–10.8)

## 2018-12-14 LAB — COMP. METABOLIC PANEL (12)
AST: 20 IU/L (ref 0–40)
Albumin/Globulin Ratio: 1.9 (ref 1.2–2.2)
Albumin: 4.3 g/dL (ref 3.8–4.8)
Alkaline Phosphatase: 56 IU/L (ref 39–117)
BUN/Creatinine Ratio: 16 (ref 10–24)
BUN: 20 mg/dL (ref 8–27)
Bilirubin Total: 2.8 mg/dL — ABNORMAL HIGH (ref 0.0–1.2)
Calcium: 9.1 mg/dL (ref 8.6–10.2)
Chloride: 104 mmol/L (ref 96–106)
Creatinine, Ser: 1.24 mg/dL (ref 0.76–1.27)
GFR calc Af Amer: 69 mL/min/{1.73_m2} (ref >59)
GFR calc non Af Amer: 59 mL/min/{1.73_m2} — ABNORMAL LOW (ref >59)
Globulin, Total: 2.3 g/dL (ref 1.5–4.5)
Glucose: 98 mg/dL (ref 65–99)
Potassium: 4.3 mmol/L (ref 3.5–5.2)
Sodium: 142 mmol/L (ref 134–144)
Total Protein: 6.6 g/dL (ref 6.0–8.5)

## 2018-12-14 LAB — LIPID PANEL
Chol/HDL Ratio: 5.7 ratio — ABNORMAL HIGH (ref 0.0–5.0)
Cholesterol, Total: 188 mg/dL (ref 100–199)
HDL: 33 mg/dL — ABNORMAL LOW (ref >39)
LDL Chol Calc (NIH): 138 mg/dL — ABNORMAL HIGH (ref 0–99)
Triglycerides: 92 mg/dL (ref 0–149)
VLDL Cholesterol Cal: 17 mg/dL (ref 5–40)

## 2018-12-14 LAB — TSH: TSH: 1.24 u[IU]/mL (ref 0.450–4.500)

## 2018-12-14 LAB — PSA: Prostate Specific Ag, Serum: 3.6 ng/mL (ref 0.0–4.0)

## 2019-01-30 DIAGNOSIS — R69 Illness, unspecified: Secondary | ICD-10-CM | POA: Diagnosis not present

## 2019-02-25 DIAGNOSIS — R69 Illness, unspecified: Secondary | ICD-10-CM | POA: Diagnosis not present

## 2019-03-14 ENCOUNTER — Ambulatory Visit: Payer: Medicare HMO | Attending: Internal Medicine

## 2019-03-14 DIAGNOSIS — Z20822 Contact with and (suspected) exposure to covid-19: Secondary | ICD-10-CM

## 2019-03-20 LAB — NOVEL CORONAVIRUS, NAA

## 2019-07-26 ENCOUNTER — Encounter: Payer: Self-pay | Admitting: Family

## 2019-07-26 ENCOUNTER — Other Ambulatory Visit: Payer: Self-pay

## 2019-07-26 ENCOUNTER — Ambulatory Visit (INDEPENDENT_AMBULATORY_CARE_PROVIDER_SITE_OTHER): Payer: Medicare HMO | Admitting: Family

## 2019-07-26 ENCOUNTER — Telehealth: Payer: Self-pay | Admitting: Cardiovascular Disease

## 2019-07-26 VITALS — BP 146/82 | HR 47 | Ht 68.0 in | Wt 172.2 lb

## 2019-07-26 DIAGNOSIS — I493 Ventricular premature depolarization: Secondary | ICD-10-CM

## 2019-07-26 DIAGNOSIS — R002 Palpitations: Secondary | ICD-10-CM | POA: Diagnosis not present

## 2019-07-26 DIAGNOSIS — G4733 Obstructive sleep apnea (adult) (pediatric): Secondary | ICD-10-CM

## 2019-07-26 DIAGNOSIS — R079 Chest pain, unspecified: Secondary | ICD-10-CM | POA: Diagnosis not present

## 2019-07-26 DIAGNOSIS — R072 Precordial pain: Secondary | ICD-10-CM

## 2019-07-26 DIAGNOSIS — Z01812 Encounter for preprocedural laboratory examination: Secondary | ICD-10-CM

## 2019-07-26 DIAGNOSIS — E782 Mixed hyperlipidemia: Secondary | ICD-10-CM

## 2019-07-26 MED ORDER — NITROGLYCERIN 0.4 MG SL SUBL
0.4000 mg | SUBLINGUAL_TABLET | SUBLINGUAL | 4 refills | Status: DC | PRN
Start: 2019-07-26 — End: 2020-04-07

## 2019-07-26 NOTE — Telephone Encounter (Signed)
Patient being seen today @ 9:30am and he has arrived for his appt. Closing this encounter.

## 2019-07-26 NOTE — Patient Instructions (Addendum)
Medication Instructions:  Your physician has recommended you make the following change in your medication:   START Nitroglycerin as needed for chest pain   *If you need a refill on your cardiac medications before your next appointment, please call your pharmacy*   Lab Work: Your physician recommends that you return for lab work 3-5 days before your CT scan for a BMET  Testing/Procedures: Your EKG today shows sinus bradycardia which is a slow but regular heart beat.  Follow-Up: At [facility], you and your health needs are our priority.  As part of our continuing mission to provide you with exceptional heart care, we have created designated Provider Care Teams.  These Care Teams include your primary Cardiologist (physician) and Advanced Practice Providers (APPs -  Physician Assistants and Nurse Practitioners) who all work together to provide you with the care you need, when you need it.  We recommend signing up for the patient portal called "MyChart".  Sign up information is provided on this After Visit Summary.  MyChart is used to connect with patients for Virtual Visits (Telemedicine).  Patients are able to view lab/test results, encounter notes, upcoming appointments, etc.  Non-urgent messages can be sent to your provider as well.   To learn more about what you can do with MyChart, go to [email or web address].    Your next appointment:   After cardiac CT  The format for your next appointment:   In Person  Provider:   You may see Ida Rogue, MD or one of the following Advanced Practice Providers on your designated Care Team:    Murray Hodgkins, NP  Christell Faith, PA-C  Marrianne Mood, PA-C  Other Instructions  Nitroglycerin sublingual tablets What is this medicine? NITROGLYCERIN (nye troe GLI ser in) is a type of vasodilator. It relaxes blood vessels, increasing the blood and oxygen supply to your heart. This medicine is used to relieve chest pain caused by angina. It  is also used to prevent chest pain before activities like climbing stairs, going outdoors in cold weather, or sexual activity. This medicine may be used for other purposes; ask your health care provider or pharmacist if you have questions. COMMON BRAND NAME(S): Nitroquick, Nitrostat, Nitrotab What should I tell my health care provider before I take this medicine? They need to know if you have any of these conditions:  anemia  head injury, recent stroke, or bleeding in the brain  liver disease  previous heart attack  an unusual or allergic reaction to nitroglycerin, other medicines, foods, dyes, or preservatives  pregnant or trying to get pregnant  breast-feeding How should I use this medicine? Take this medicine by mouth as needed. At the first sign of an angina attack (chest pain or tightness) place one tablet under your tongue. You can also take this medicine 5 to 10 minutes before an event likely to produce chest pain. Follow the directions on the prescription label. Let the tablet dissolve under the tongue. Do not swallow whole. Replace the dose if you accidentally swallow it. It will help if your mouth is not dry. Saliva around the tablet will help it to dissolve more quickly. Do not eat or drink, smoke or chew tobacco while a tablet is dissolving. If you are not better within 5 minutes after taking ONE dose of nitroglycerin, call 9-1-1 immediately to seek emergency medical care. Do not take more than 3 nitroglycerin tablets over 15 minutes. If you take this medicine often to relieve symptoms of angina, your doctor or health  care professional may provide you with different instructions to manage your symptoms. If symptoms do not go away after following these instructions, it is important to call 9-1-1 immediately. Do not take more than 3 nitroglycerin tablets over 15 minutes. Talk to your pediatrician regarding the use of this medicine in children. Special care may be needed. Overdosage:  If you think you have taken too much of this medicine contact a poison control center or emergency room at once. NOTE: This medicine is only for you. Do not share this medicine with others. What if I miss a dose? This does not apply. This medicine is only used as needed. What may interact with this medicine? Do not take this medicine with any of the following medications:  certain migraine medicines like ergotamine and dihydroergotamine (DHE)  medicines used to treat erectile dysfunction like sildenafil, tadalafil, and vardenafil  riociguat This medicine may also interact with the following medications:  alteplase  aspirin  heparin  medicines for high blood pressure  medicines for mental depression  other medicines used to treat angina  phenothiazines like chlorpromazine, mesoridazine, prochlorperazine, thioridazine This list may not describe all possible interactions. Give your health care provider a list of all the medicines, herbs, non-prescription drugs, or dietary supplements you use. Also tell them if you smoke, drink alcohol, or use illegal drugs. Some items may interact with your medicine. What should I watch for while using this medicine? Tell your doctor or health care professional if you feel your medicine is no longer working. Keep this medicine with you at all times. Sit or lie down when you take your medicine to prevent falling if you feel dizzy or faint after using it. Try to remain calm. This will help you to feel better faster. If you feel dizzy, take several deep breaths and lie down with your feet propped up, or bend forward with your head resting between your knees. You may get drowsy or dizzy. Do not drive, use machinery, or do anything that needs mental alertness until you know how this drug affects you. Do not stand or sit up quickly, especially if you are an older patient. This reduces the risk of dizzy or fainting spells. Alcohol can make you more drowsy and  dizzy. Avoid alcoholic drinks. Do not treat yourself for coughs, colds, or pain while you are taking this medicine without asking your doctor or health care professional for advice. Some ingredients may increase your blood pressure. What side effects may I notice from receiving this medicine? Side effects that you should report to your doctor or health care professional as soon as possible:  blurred vision  dry mouth  skin rash  sweating  the feeling of extreme pressure in the head  unusually weak or tired Side effects that usually do not require medical attention (report to your doctor or health care professional if they continue or are bothersome):  flushing of the face or neck  headache  irregular heartbeat, palpitations  nausea, vomiting This list may not describe all possible side effects. Call your doctor for medical advice about side effects. You may report side effects to FDA at 1-800-FDA-1088. Where should I keep my medicine? Keep out of the reach of children. Store at room temperature between 20 and 25 degrees C (68 and 77 degrees F). Store in Chief of Staff. Protect from light and moisture. Keep tightly closed. Throw away any unused medicine after the expiration date. NOTE: This sheet is a summary. It may not cover all  possible information. If you have questions about this medicine, talk to your doctor, pharmacist, or health care provider.  2020 Elsevier/Gold Standard (2012-12-27 17:57:36) Your cardiac CT will be scheduled at one of the below locations:   [facility] [place], [place] [address] [phone]  If scheduled at [facility], please arrive at the [facility] main entrance of [facility] 30 minutes prior to test start time. Proceed to the [facility] Radiology Department (first floor) to check-in and test prep.  If scheduled at [facility], please  arrive 15 mins early for check-in and test prep.  Please follow these instructions carefully (unless otherwise directed):  Hold all erectile dysfunction medications at least 3 days (72 hrs) prior to test.  On the Night Before the Test: . Be sure to Drink plenty of water. . Do not consume any caffeinated/decaffeinated beverages or chocolate 12 hours prior to your test. . Do not take any antihistamines 12 hours prior to your test. On the Day of the Test: . Drink plenty of water. Do not drink any water within one hour of the test. . Do not eat any food 4 hours prior to the test. . You may take your regular medications prior to the test.   *For Clinical Staff only. Please instruct patient the following:*        -Drink plenty of water       -After the Test: . Drink plenty of water. . After receiving IV contrast, you may experience a mild flushed feeling. This is normal. . On occasion, you may experience a mild rash up to 24 hours after the test. This is not dangerous. If this occurs, you can take Benadryl 25 mg and increase your fluid intake. . If you experience trouble breathing, this can be serious. If it is severe call 911 IMMEDIATELY. If it is mild, please call our office. . If you take any of these medications: Glipizide/Metformin, Avandament, Glucavance, please do not take 48 hours after completing test unless otherwise instructed.   Once we have confirmed authorization from your insurance company, we will call you to set up a date and time for your test.   For non-scheduling related questions, please contact the cardiac imaging nurse navigator should you have any questions/concerns: Marchia Bond, Cardiac Imaging Nurse Navigator Burley Saver, Interim Cardiac Imaging Nurse [place] [facility] Direct Office Dial: [phone]   For scheduling needs, including cancellations and rescheduling, please call [phone].

## 2019-07-26 NOTE — Telephone Encounter (Signed)
Pt c/o of Chest Pain: STAT if CP now or developed within 24 hours  1. Are you having CP right now? no  2. Are you experiencing any other symptoms (ex. SOB, nausea, vomiting, sweating)?  Sob lightheadedness  3. How long have you been experiencing CP? Last night single episode   4. Is your CP continuous or coming and going? One episode   5. Have you taken Nitroglycerin?  ASA last night and this morning  ? ADD ON TODAY with Urban Gibson

## 2019-07-26 NOTE — Progress Notes (Signed)
Office Visit    Patient Name: Zachary Medina Date of Encounter: 07/26/2019  Primary Care Provider:  Jerrol Banana., MD Primary Cardiologist:  Ida Rogue, MD Electrophysiologist:  None   Chief Complaint    Zachary Medina is a 69 y.o. male with a hx of GERD, kidney stones, HLD, TIA, palpitations/PVC presents today for chest pain  Past Medical History    Past Medical History:  Diagnosis Date  . GERD (gastroesophageal reflux disease)   . Hepatitis   . Hyperlipidemia   . Kidney stones   . TIA (transient ischemic attack)    Past Surgical History:  Procedure Laterality Date  . CHOLECYSTECTOMY    . COLONOSCOPY N/A 07/29/2014   Procedure: COLONOSCOPY;  Surgeon: Lucilla Lame, MD;  Location: [facility] ENDOSCOPY;  Service: Endoscopy;  Laterality: N/A;  . HERNIA REPAIR      Allergies  No Known Allergies  History of Present Illness    Zachary Medina is a 69 y.o. male with a hx of GERD, OSA on CPAP, kidney stones, HLD, TIA, palpitations/PVC last seen 07/11/2017.  He was previously evaluated 06/2017 for palpitations.  Event monitor showing frequent PVCs of 3.7% burden.  As he was asymptomatic and baseline bradycardia medical therapy was deferred.  Called the office today reporting chest pain with scheduled for office visit.  Tells me that every once in a while if he has to grab the dog suddenly he will get a cramping feeling in the left side of his chest.  Most recently this happened immediately grabbed the dog. Happened immediately after grabbing the dog. Lasting about a minute. He took two aspirin. No shortness of breath or diaphoresis. Pain was in his left chest and went a little bit into his left arm. Tells me he muscle felt enlarged or hard. Describes as a sharp tightness.  Family history noted for father and both of his grandparents with heart disease.  He does note that he push mows without chest pain, dyspnea.  This has happened in the past, but very intermittently and not as  severe. Was on statin in the past for about a year which he tolerated well but stopped taking it for unclear reason.  Does endorse a lot of indigestion recently. Triggered by spicy foods.  Relieved by Tums.  Reports some lightheadedness with position changes.  Can notice skipped beats in the evening when laying down.  Reports these are overall not bothersome.  He has CPAP but needs new equipment.  We discussed the relationship between sleep apnea and palpitations as well as heart failure.  EKGs/Labs/Other Studies Reviewed:   The following studies were reviewed today:  EKG:  EKG is ordered today.  The ekg ordered today demonstrates SB 48 bpm with rigthwardacis and sinus arrhythmia.   Recent Labs: 12/13/2018: BUN 20; Creatinine, Ser 1.24; Hemoglobin 16.7; Platelets 145; Potassium 4.3; Sodium 142; TSH 1.240  Recent Lipid Panel    Component Value Date/Time   CHOL 188 12/13/2018 0906   TRIG 92 12/13/2018 0906   HDL 33 (L) 12/13/2018 0906   CHOLHDL 5.7 (H) 12/13/2018 0906   LDLCALC 138 (H) 12/13/2018 0906    Home Medications   Current Meds  Medication Sig  . Ascorbic Acid (VITAMIN C PO) Take 1 tablet by mouth daily.  Marland Kitchen aspirin 81 MG tablet Take 81 mg by mouth daily.   . Cyanocobalamin (VITAMIN B-12 PO) Take 1 tablet by mouth daily.  . magnesium oxide (MAG-OX) 400 MG tablet Take 1 tablet (400 mg total)  by mouth at bedtime.  Marland Kitchen MAGNESIUM PO Take 1 tablet by mouth daily.  . Omega 3 1000 MG CAPS Take by mouth.  . Vitamin D, Cholecalciferol, 1000 units TABS Take by mouth.      Review of Systems      Review of Systems  Constitution: Negative for chills, fever and malaise/fatigue.  Cardiovascular: Negative for chest pain, dyspnea on exertion, leg swelling, near-syncope, orthopnea, palpitations and syncope.  Respiratory: Negative for cough, shortness of breath and wheezing.   Gastrointestinal: Negative for nausea and vomiting.  Neurological: Negative for dizziness, light-headedness  and weakness.   All other systems reviewed and are otherwise negative except as noted above.  Physical Exam    VS:  BP (!) 146/82   Pulse (!) 47   Ht 5\' 8"  (1.727 m)   Wt 172 lb 3.2 oz (78.1 kg)   SpO2 100%   BMI 26.18 kg/m  , BMI Body mass index is 26.18 kg/m. GEN: Well nourished, well developed, in no acute distress. HEENT: normal. Neck: Supple, no JVD, carotid bruits, or masses. Cardiac: RRR, no murmurs, rubs, or gallops. No clubbing, cyanosis, edema.  Radials/DP/PT 2+ and equal bilaterally.  Respiratory:  Respirations regular and unlabored, clear to auscultation bilaterally. GI: Soft, nontender, nondistended, BS + x 4. MS: No deformity or atrophy. Skin: Warm and dry, no rash. Neuro:  Strength and sensation are intact. Psych: Normal affect.  Assessment & Plan    1. Chest pain -Occurred in the setting of activity in the left chest radiating to the left arm not associated with shortness of breath or diaphoresis.  Somewhat atypical as he is able to push mow the yard without difficulty.  However, given hyperlipidemia and significant family history of heart disease plan for evaluation. Plan for cardiac CTA.  2. HLD  - lipid panel 12/14/19 with toal 188, HDL 33, triglycerides 92, LDL 138.  Was previously on statin for 1 year but stopped for unclear reason.  Will await results of cardiac CT to determine an LDL goal of less than 100 versus less than 70.  He is agreeable to start cholesterol medication.  3. PVC - Reports intermittent palpitations. Encouraged to avoid caffeine, etoh, and to manage stress. No indication for beta blocker at this time as he has baseline bradycardia and symptoms are not bothersome.   4. OSA - CPAP compliance encouraged.   Disposition: Cardiac CTA.  Follow up in 8 week(s) with Dr. Rockey Situ or APP.    Loel Dubonnet, NP 07/26/2019, 7:16 PM

## 2019-08-07 DIAGNOSIS — H903 Sensorineural hearing loss, bilateral: Secondary | ICD-10-CM | POA: Diagnosis not present

## 2019-08-07 DIAGNOSIS — H6121 Impacted cerumen, right ear: Secondary | ICD-10-CM | POA: Diagnosis not present

## 2019-08-15 ENCOUNTER — Inpatient Hospital Stay: Admission: RE | Admit: 2019-08-15 | Payer: Medicare HMO | Source: Ambulatory Visit

## 2019-08-20 ENCOUNTER — Telehealth (HOSPITAL_COMMUNITY): Payer: Self-pay | Admitting: *Deleted

## 2019-08-20 NOTE — Telephone Encounter (Signed)
Attempted to call patient regarding upcoming cardiac CT appointment. Left message on voicemail with name and callback number  Merle Tai RN Navigator Cardiac Imaging [facility] [phone] Office [phone] Cell  

## 2019-08-22 ENCOUNTER — Other Ambulatory Visit: Payer: Self-pay

## 2019-08-22 ENCOUNTER — Ambulatory Visit
Admission: RE | Admit: 2019-08-22 | Discharge: 2019-08-22 | Disposition: A | Discharge status: Home / Self Care | Payer: Medicare HMO | Source: Ambulatory Visit | Attending: Family | Admitting: Family

## 2019-08-22 DIAGNOSIS — R072 Precordial pain: Secondary | ICD-10-CM | POA: Insufficient documentation

## 2019-08-22 LAB — POCT I-STAT CREATININE: Creatinine, Ser: 1.1 mg/dL (ref 0.61–1.24)

## 2019-08-22 IMAGING — CT CT HEART MORP W/ CTA COR W/ SCORE W/ CA W/CM &/OR W/O CM
1 of 14 series · 3 of 20 positions shown, 4 images · non-contrast
Comparison: None.

Addendum:
CLINICAL DATA: chestpain

EXAM:
Cardiac/Coronary  CTA
TECHNIQUE: The patient was scanned on a Siemens Somatoform go.Top scanner.

[Series 46: ms multiphase cta coronary 0.60 · axial · 0.38mm/px · z∈[-1142,-1079]mm · 3 of 2835 slices shown, 4 images]
[im 709/2835  vessel]
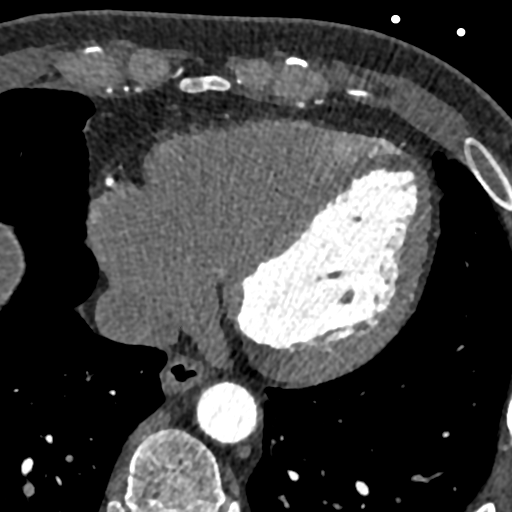
[im 709/2835  lung]
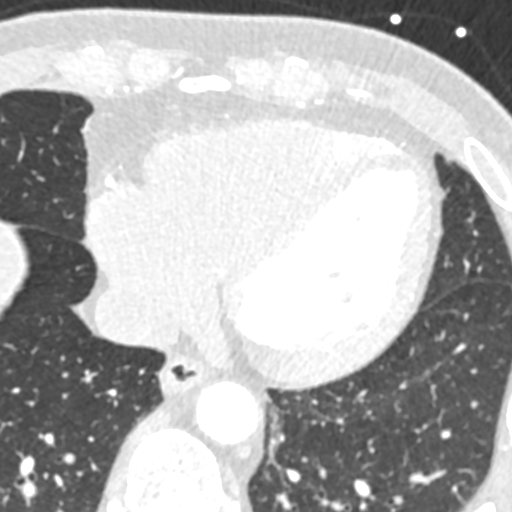
[im 1418/2835  vessel]
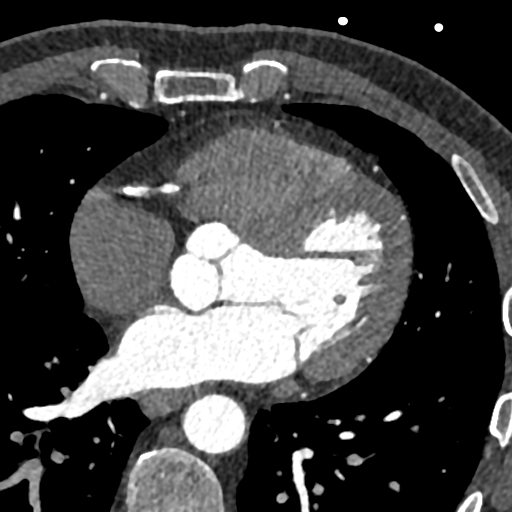
[im 2126/2835  vessel]
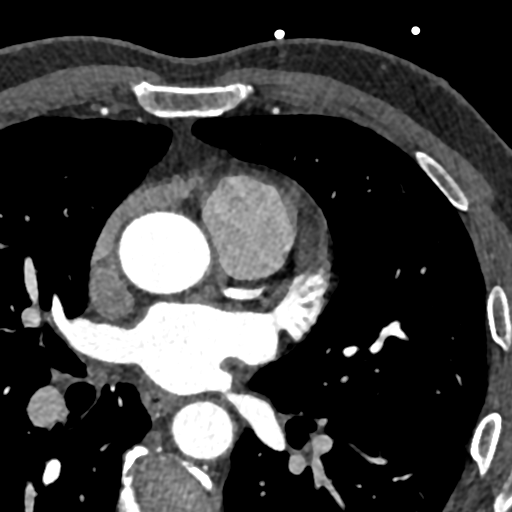

[3 of 20 positions shown; findings below may reference images not displayed]

FINDINGS: A retrospective scan was triggered in the descending thoracic aorta.
Axial non-contrast 3 mm slices were carried out through the heart.
The data set was analyzed on a dedicated work station and scored
using the Agatson method. Gantry rotation speed was 330 msecs and
collimation was .6 mm. 100mg of metoprolol, 10mg iv cardizem and
mg of sl NTG was given. The 3D data set was reconstructed in 5%
intervals of the 50-95 % of the R-R cycle. Diastolic phases were
analyzed on a dedicated work station using MPR, MIP and VRT modes.
The patient received 75 cc of contrast.

Aorta:  Normal size.  No calcifications.  No dissection.

Aortic Valve:  Trileaflet.  No calcifications.

Coronary Arteries:  Normal coronary origin.  Right dominance.

RCA is a large dominant artery that gives rise to PDA and PLA. There
is no plaque.

Left main is a large artery that gives rise to LAD and LCX arteries.

LAD is a large vessel that has no plaque.

LCX is a non-dominant artery that gives rise to two obtuse marginal
branches. There is no plaque.

Other findings:

Normal pulmonary vein drainage into the left atrium.

Normal left atrial appendage without a thrombus.

Normal size of the pulmonary artery.
IMPRESSION: 1. Coronary calcium score of 0. Patient is low risk for near term
coronary events

2. Normal coronary origin with right dominance.

3. No evidence of CAD.

4. CAD-RADS 0. Consider non-atherosclerotic causes of chest pain.

EXAM:
OVER-READ INTERPRETATION  CT CHEST

The following report is an over-read performed by radiologist Dr.
over-read does not include interpretation of cardiac or coronary
anatomy or pathology. The coronary CTA and coronary calcium score
interpretation by the cardiologist is attached.
FINDINGS: Vascular: Mild cardiac enlargement.  No pericardial effusion noted.

Mediastinum/nodes: No mass or adenopathy identified. Imaged portions
of the esophagus are unremarkable.

Lungs/pleura: Lungs are clear. No pleural effusion, atelectasis or
pneumothorax.

Upper abdomen: The no acute abnormality. Scattered low-attenuation
liver lesions noted likely reflecting benign simple cysts. The
largest is near the dome measuring 2.7 cm.

Musculoskeletal: No acute or suspicious osseous findings.
IMPRESSION: 1. No active cardiopulmonary abnormalities.
2. Mild cardiac enlargement.

*** End of Addendum ***
FINDINGS: A retrospective scan was triggered in the descending thoracic aorta.
Axial non-contrast 3 mm slices were carried out through the heart.
The data set was analyzed on a dedicated work station and scored
using the Agatson method. Gantry rotation speed was 330 msecs and
collimation was .6 mm. 100mg of metoprolol, 10mg iv cardizem and
mg of sl NTG was given. The 3D data set was reconstructed in 5%
intervals of the 50-95 % of the R-R cycle. Diastolic phases were
analyzed on a dedicated work station using MPR, MIP and VRT modes.
The patient received 75 cc of contrast.

Aorta:  Normal size.  No calcifications.  No dissection.

Aortic Valve:  Trileaflet.  No calcifications.

Coronary Arteries:  Normal coronary origin.  Right dominance.

RCA is a large dominant artery that gives rise to PDA and PLA. There
is no plaque.

Left main is a large artery that gives rise to LAD and LCX arteries.

LAD is a large vessel that has no plaque.

LCX is a non-dominant artery that gives rise to two obtuse marginal
branches. There is no plaque.

Other findings:

Normal pulmonary vein drainage into the left atrium.

Normal left atrial appendage without a thrombus.

Normal size of the pulmonary artery.
IMPRESSION: 1. Coronary calcium score of 0. Patient is low risk for near term
coronary events

2. Normal coronary origin with right dominance.

3. No evidence of CAD.

4. CAD-RADS 0. Consider non-atherosclerotic causes of chest pain.

## 2019-08-22 MED ORDER — NITROGLYCERIN 0.4 MG SL SUBL
0.8000 mg | SUBLINGUAL_TABLET | Freq: Once | SUBLINGUAL | Status: AC
  Administered 2019-08-22: 0.8 mg via SUBLINGUAL

## 2019-08-22 MED ORDER — DILTIAZEM HCL 25 MG/5ML IV SOLN
10.0000 mg | Freq: Once | INTRAVENOUS | Status: AC
  Administered 2019-08-22: 10 mg via INTRAVENOUS

## 2019-08-22 MED ORDER — IOHEXOL 350 MG/ML SOLN
75.0000 mL | Freq: Once | INTRAVENOUS | Status: AC | PRN
  Administered 2019-08-22: 75 mL via INTRAVENOUS

## 2019-08-22 NOTE — Progress Notes (Addendum)
Patient tolerated CT well. Drank water and soda as well as ate peanut butter crackers after CT. Ambulatory to exit steady gait.

## 2019-08-23 ENCOUNTER — Telehealth: Payer: Self-pay | Admitting: *Deleted

## 2019-08-23 DIAGNOSIS — E7801 Familial hypercholesterolemia: Secondary | ICD-10-CM

## 2019-08-23 MED ORDER — ROSUVASTATIN CALCIUM 10 MG PO TABS
10.0000 mg | ORAL_TABLET | Freq: Every day | ORAL | 3 refills | Status: DC
Start: 2019-08-23 — End: 2019-09-30

## 2019-08-23 NOTE — Telephone Encounter (Signed)
Reviewed results and recommendations with patient and he did inquire about side effects of medication. I did review some of the most common but also reviewed information on benefits of medication. He was agreeable to start this and also reviewed that we would like to get repeat labs in 8 weeks over at the [facility] entrance of the hospital. He verbalized understanding of our conversation, agreement with plan, and since he was driving I did mention that I would send My Chart message with this information as well. He was appreciative with no further questions.

## 2019-08-23 NOTE — Telephone Encounter (Signed)
-----   Message from Loel Dubonnet, NP sent at 08/22/2019  2:26 PM EDT ----- Cardiac CT shows coronary calcium score of 0. No evidence of heart disease. Great result! Chest pain episode was non-cardiac.   Due to family history of heart disease and elevated LDL of 138 ("lousy cholesterol"), recommend LDL goal of less than 100. Recommend starting Crestor 10mg  daily with repeat lipid panel/liver function in 8 weeks.

## 2019-08-30 ENCOUNTER — Ambulatory Visit: Payer: Medicare HMO | Admitting: Family

## 2019-09-02 DIAGNOSIS — R69 Illness, unspecified: Secondary | ICD-10-CM | POA: Diagnosis not present

## 2019-09-23 DIAGNOSIS — G4733 Obstructive sleep apnea (adult) (pediatric): Secondary | ICD-10-CM | POA: Diagnosis not present

## 2019-09-24 DIAGNOSIS — H2513 Age-related nuclear cataract, bilateral: Secondary | ICD-10-CM | POA: Diagnosis not present

## 2019-09-24 DIAGNOSIS — H0288B Meibomian gland dysfunction left eye, upper and lower eyelids: Secondary | ICD-10-CM | POA: Diagnosis not present

## 2019-09-24 DIAGNOSIS — H0288A Meibomian gland dysfunction right eye, upper and lower eyelids: Secondary | ICD-10-CM | POA: Diagnosis not present

## 2019-09-24 DIAGNOSIS — H43399 Other vitreous opacities, unspecified eye: Secondary | ICD-10-CM | POA: Diagnosis not present

## 2019-09-29 NOTE — Progress Notes (Signed)
Cardiology Office Note  Date:  09/30/2019   ID:  Zachary Medina, DOB 01-12-1951, MRN [id number]  PCP:  Jerrol Banana., MD   Chief Complaint  Patient presents with   OTHER    F/u CT , no complaints today.  Meds reviewed verbally with pt.    HPI:  Zachary Medina is a 69 year old gentleman with past medical history of GERD, kidney stones, hyperlipidemia, TIA Obstructive sleep apnea on CPAP Who presents for follow-up of his palpitations  Last seen by myself in clinic April 2019 At that time reported appreciating palpitations more at nighttime He had prior symptoms in July 2018  Seen in cardiology clinic May 2021 by one of our providers, he reported having some chest pain Once in a while would have cramping left-sided chest pain walking the dog A cardiac CTA was ordered  Cardiac CTA , report was pulled up and discussed 1. Coronary calcium score of 0. Patient is low risk for near term coronary events 2. Normal coronary origin with right dominance. 3. No evidence of CAD. 4. CAD-RADS 0. Consider non-atherosclerotic causes of chest pain.  On further discussion, No chest pain, active, preaching On CPAP  Rare palpitations Notices the extra beats more when he lays down at nighttime Has had similar symptoms in the past  Family history Father with open heart surgery, but details unclear Both grandfathers with cardiac disease   EKG personally reviewed by myself on todays visit Shows sinus bradycardia rate 53 bpm no significant ST or T wave changes, rare PVC   PMH:   has a past medical history of GERD (gastroesophageal reflux disease), Hepatitis, Hyperlipidemia, Kidney stones, and TIA (transient ischemic attack).  PSH:    Past Surgical History:  Procedure Laterality Date   CHOLECYSTECTOMY     COLONOSCOPY N/A 07/29/2014   Procedure: COLONOSCOPY;  Surgeon: Lucilla Lame, MD;  Location: [facility] ENDOSCOPY;  Service: Endoscopy;  Laterality: N/A;   HERNIA REPAIR       Current Outpatient Medications  Medication Sig Dispense Refill   Ascorbic Acid (VITAMIN C PO) Take 1 tablet by mouth daily.     aspirin 81 MG tablet Take 81 mg by mouth daily.      Cyanocobalamin (VITAMIN B-12 PO) Take 1 tablet by mouth daily.     magnesium oxide (MAG-OX) 400 MG tablet Take 1 tablet (400 mg total) by mouth at bedtime. 30 tablet 11   MAGNESIUM PO Take 1 tablet by mouth daily.     nitroGLYCERIN (NITROSTAT) 0.4 MG SL tablet Place 1 tablet (0.4 mg total) under the tongue every 5 (five) minutes as needed for up to 25 doses for chest pain. 25 tablet 4   Omega 3 1000 MG CAPS Take by mouth.     rosuvastatin (CRESTOR) 10 MG tablet Take 1 tablet (10 mg total) by mouth daily with supper. 90 tablet 3   Vitamin D, Cholecalciferol, 1000 units TABS Take by mouth.     No current facility-administered medications for this visit.     Allergies:   Patient has no known allergies.   Social History:  The patient  reports that he has never smoked. He has never used smokeless tobacco. He reports that he does not drink alcohol and does not use drugs.   Family History:   family history includes Arthritis in his father and mother; Congestive Heart Failure in his father; Diabetes in his brother; Glaucoma in his mother; Heart disease in his father; Parkinson's disease in his father; Prostate cancer  in his father.    Review of Systems: Review of Systems  Constitutional: Negative.   HENT: Negative.   Respiratory: Negative.   Cardiovascular: Negative.   Gastrointestinal: Negative.   Musculoskeletal: Negative.   Neurological: Negative.   Psychiatric/Behavioral: Negative.   All other systems reviewed and are negative.   PHYSICAL EXAM: VS:  BP 132/80 (BP Location: Left Arm, Patient Position: Sitting, Cuff Size: Normal)    Pulse (!) 53    Ht 5\' 8"  (1.727 m)    Wt 171 lb 6 oz (77.7 kg)    SpO2 98%    BMI 26.06 kg/m  , BMI Body mass index is 26.06 kg/m. Constitutional:  oriented to  person, place, and time. No distress.  HENT:  Head: Grossly normal Eyes:  no discharge. No scleral icterus.  Neck: No JVD, no carotid bruits  Cardiovascular: Regular rate and rhythm, no murmurs appreciated Pulmonary/Chest: Clear to auscultation bilaterally, no wheezes or rails Abdominal: Soft.  no distension.  no tenderness.  Musculoskeletal: Normal range of motion Neurological:  normal muscle tone. Coordination normal. No atrophy Skin: Skin warm and dry Psychiatric: normal affect, pleasant   Recent Labs: 12/13/2018: BUN 20; Hemoglobin 16.7; Platelets 145; Potassium 4.3; Sodium 142; TSH 1.240 08/22/2019: Creatinine, Ser 1.10    Lipid Panel Lab Results  Component Value Date   CHOL 188 12/13/2018   HDL 33 (L) 12/13/2018   LDLCALC 138 (H) 12/13/2018   TRIG 92 12/13/2018      Wt Readings from Last 3 Encounters:  09/30/19 171 lb 6 oz (77.7 kg)  07/26/19 172 lb 3.2 oz (78.1 kg)  12/13/18 173 lb 9.6 oz (78.7 kg)       ASSESSMENT AND PLAN:  Irregular heartbeat/PVCs Continues to have rare PVCs, no further work-up, no changes to his medications Beta-blockers could be used either on an as needed or daily basis for worsening symptoms Finding discussed in detail  H/O transient cerebral ischemia Many years ago, he is on low-dose aspirin Recent CT scan findings discussed with him  Mixed hyperlipidemia He prefers not to be on Crestor Given calcium score of zero, CTA cardiac study with no findings, this is acceptable We did discuss his insurance denial of the FFR part of the CT heart study We discussed with Dr. Mylo Red who read his study, the FFR part of the test was not sent as it was not indicated and as such there should be no bill This was relayed to the patient through nursing   Disposition:   F/U as needed   No orders of the defined types were placed in this encounter.    Signed, Esmond Plants, M.D., Ph.D. 09/30/2019  [place],  [place]

## 2019-09-30 ENCOUNTER — Ambulatory Visit: Payer: Medicare HMO | Admitting: Cardiovascular Disease

## 2019-09-30 ENCOUNTER — Other Ambulatory Visit: Payer: Self-pay

## 2019-09-30 ENCOUNTER — Encounter: Payer: Self-pay | Admitting: Cardiovascular Disease

## 2019-09-30 VITALS — BP 132/80 | HR 53 | Ht 68.0 in | Wt 171.4 lb

## 2019-09-30 DIAGNOSIS — R002 Palpitations: Secondary | ICD-10-CM

## 2019-09-30 NOTE — Patient Instructions (Signed)
Medication Instructions:  No changes  If you need a refill on your cardiac medications before your next appointment, please call your pharmacy.    Lab work: No new labs needed   If you have labs (blood work) drawn today and your tests are completely normal, you will receive your results only by: . MyChart Message (if you have MyChart) OR . A paper copy in the mail If you have any lab test that is abnormal or we need to change your treatment, we will call you to review the results.   Testing/Procedures: No new testing needed   Follow-Up: At [facility], you and your health needs are our priority.  As part of our continuing mission to provide you with exceptional heart care, we have created designated Provider Care Teams.  These Care Teams include your primary Cardiologist (physician) and Advanced Practice Providers (APPs -  Physician Assistants and Nurse Practitioners) who all work together to provide you with the care you need, when you need it.  . You will need a follow up appointment as needed  . Providers on your designated Care Team:   . Christopher Berge, NP . Ryan Dunn, PA-C . Jacquelyn Visser, PA-C  Any Other Special Instructions Will Be Listed Below (If Applicable).  COVID-19 Vaccine Information can be found at: [email or web address] For questions related to vaccine distribution or appointments, please email [email or web address] or call [phone].     

## 2019-10-23 ENCOUNTER — Telehealth (INDEPENDENT_AMBULATORY_CARE_PROVIDER_SITE_OTHER): Payer: Medicare HMO | Admitting: Family Medicine

## 2019-10-23 DIAGNOSIS — J020 Streptococcal pharyngitis: Secondary | ICD-10-CM | POA: Diagnosis not present

## 2019-10-23 MED ORDER — AMOXICILLIN 875 MG PO TABS: 875.0000 mg | ORAL_TABLET | Freq: Two times a day (BID) | ORAL | 0 refills | Status: DC

## 2019-10-23 NOTE — Progress Notes (Signed)
Virtual Visit via Telephone Note  I connected with Zachary Medina on 10/23/19 at 11:20 AM EDT by telephone and verified that I am speaking with the correct person using two identifiers.  Location: Patient: Home  Provider: Office   I discussed the limitations, risks, security and privacy concerns of performing an evaluation and management service by telephone and the availability of in person appointments. I also discussed with the patient that there may be a patient responsible charge related to this service. The patient expressed understanding and agreed to proceed. I could not hear the patient on the video visit so after I examined him visually we did a phone visit.  History of Present Illness: Patient complains of 3 days of sore throat only.  No fever chills or cough.  No known Covid exposure.  He has not had the Covid vaccine.  No headache or myalgias.  He says he can see white spots in the back of his throat.   Observations/Objective: Patient in no acute distress.  Breathing well.  Easily speaking in sentences. I cannot visualize back of his throat via the camera or any exudates.  Assessment and Plan: 1. Pharyngitis due to Streptococcus species Presumed strep but due to lack of Covid vaccine recommend the patient isolate and get Covid testing.  If he worsens he will let us know.  Advised him to go get the Covid PCR testing.  Follow-up as needed. - amoxicillin (AMOXIL) 875 MG tablet; Take 1 tablet (875 mg total) by mouth 2 (two) times daily.  Dispense: 20 tablet; Refill: 0   Follow Up Instructions: Please strongly consider Covid vaccine after this illness is resolved.   I discussed the assessment and treatment plan with the patient. The patient was provided an opportunity to ask questions and all were answered. The patient agreed with the plan and demonstrated an understanding of the instructions.   The patient was advised to call back or seek an in-person evaluation if the symptoms  worsen or if the condition fails to improve as anticipated.  I provided 11 minutes of non-face-to-face time during this encounter.   Dannette Kinkaid Cranford Mon, MD    I,April Miller,acting as a scribe for Wilhemena Durie, MD.,have documented all relevant documentation on the behalf of Wilhemena Durie, MD,as directed by  Wilhemena Durie, MD while in the presence of Wilhemena Durie, MD.  MyChart Video Visit    Virtual Visit via Video Note   This visit type was conducted due to national recommendations for restrictions regarding the COVID-19 Pandemic (e.g. social distancing) in an effort to limit this patient's exposure and mitigate transmission in our community. This patient is at least at moderate risk for complications without adequate follow up. This format is felt to be most appropriate for this patient at this time. Physical exam was limited by quality of the video and audio technology used for the visit.   Patient location: Home Provider location: Office   Patient: Zachary Medina   DOB: 03/09/1951   69 y.o. Male  MRN: [id number] Visit Date: 10/23/2019  Today's healthcare provider: Wilhemena Durie, MD   No chief complaint on file.  Subjective    HPI       Medications: Outpatient Medications Prior to Visit  Medication Sig  . Ascorbic Acid (VITAMIN C PO) Take 1 tablet by mouth daily.  Marland Kitchen aspirin 81 MG tablet Take 81 mg by mouth daily.   . Cyanocobalamin (VITAMIN B-12 PO) Take 1 tablet by mouth daily.  Marland Kitchen  magnesium oxide (MAG-OX) 400 MG tablet Take 1 tablet (400 mg total) by mouth at bedtime.  Marland Kitchen MAGNESIUM PO Take 1 tablet by mouth daily.  . nitroGLYCERIN (NITROSTAT) 0.4 MG SL tablet Place 1 tablet (0.4 mg total) under the tongue every 5 (five) minutes as needed for up to 25 doses for chest pain.  . Omega 3 1000 MG CAPS Take by mouth.  . Vitamin D, Cholecalciferol, 1000 units TABS Take by mouth.   No facility-administered medications prior to visit.    Review of  Systems     Objective    There were no vitals taken for this visit.    Physical Exam     Assessment & Plan       No follow-ups on file.     I discussed the assessment and treatment plan with the patient. The patient was provided an opportunity to ask questions and all were answered. The patient agreed with the plan and demonstrated an understanding of the instructions.   The patient was advised to call back or seek an in-person evaluation if the symptoms worsen or if the condition fails to improve as anticipated.  I provided 11 minutes of non-face-to-face time during this encounter.    Zinia Innocent Cranford Mon, MD [facility] [phone] (phone) [phone] (fax)  [place]

## 2019-10-24 DIAGNOSIS — J029 Acute pharyngitis, unspecified: Secondary | ICD-10-CM | POA: Diagnosis not present

## 2019-10-24 DIAGNOSIS — Z20822 Contact with and (suspected) exposure to covid-19: Secondary | ICD-10-CM | POA: Diagnosis not present

## 2019-11-22 NOTE — Progress Notes (Signed)
I,April Miller,acting as a scribe for Wilhemena Durie, MD.,have documented all relevant documentation on the behalf of Wilhemena Durie, MD,as directed by  Wilhemena Durie, MD while in the presence of Wilhemena Durie, MD.  Annual Wellness Visit     Patient: Zachary Medina, Male    DOB: 1951/01/26, 68 y.o.   MRN: [id number] Visit Date: 11/25/2019  Today's Provider: Wilhemena Durie, MD   Chief Complaint  Patient presents with  . Medicare Wellness   Subjective    Zachary Medina is a 69 y.o. male who presents today for his Annual Wellness Visit.Complete physical. He reports consuming a general diet. Home exercise routine includes some walking. He generally feels well. He reports sleeping fairly well. He does not have additional problems to discuss today.  HPI       Medications: Outpatient Medications Prior to Visit  Medication Sig  . Ascorbic Acid (VITAMIN C PO) Take 1 tablet by mouth daily.  Marland Kitchen aspirin 81 MG tablet Take 81 mg by mouth daily.   . Cyanocobalamin (VITAMIN B-12 PO) Take 1 tablet by mouth daily.  . magnesium oxide (MAG-OX) 400 MG tablet Take 1 tablet (400 mg total) by mouth at bedtime.  Marland Kitchen MAGNESIUM PO Take 1 tablet by mouth daily.  . nitroGLYCERIN (NITROSTAT) 0.4 MG SL tablet Place 1 tablet (0.4 mg total) under the tongue every 5 (five) minutes as needed for up to 25 doses for chest pain.  . Omega 3 1000 MG CAPS Take by mouth.  . Vitamin D, Cholecalciferol, 1000 units TABS Take by mouth.  Marland Kitchen amoxicillin (AMOXIL) 875 MG tablet Take 1 tablet (875 mg total) by mouth 2 (two) times daily. (Patient not taking: Reported on 11/25/2019)   No facility-administered medications prior to visit.    No Known Allergies  Patient Care Team: Jerrol Banana., MD as PCP - General (Family Medicine) Rockey Situ Kathlene November, MD as PCP - Cardiology (Cardiology)  Review of Systems  HENT: Positive for drooling.   Respiratory: Positive for apnea.   Psychiatric/Behavioral:  Positive for agitation.  All other systems reviewed and are negative.      Objective    Vitals: BP 132/79 (BP Location: Left Arm, Patient Position: Sitting, Cuff Size: Large)   Pulse (!) 58   Temp 97.9 F (36.6 C) (Oral)   Resp 16   Ht 5\' 8"  (1.727 m)   Wt 172 lb (78 kg)   SpO2 97%   BMI 26.15 kg/m  Wt Readings from Last 3 Encounters:  11/25/19 172 lb (78 kg)  09/30/19 171 lb 6 oz (77.7 kg)  07/26/19 172 lb 3.2 oz (78.1 kg)      Physical Exam  General Appearance:     Well developed, well nourished male. Alert, cooperative, in no acute distress, appears stated age  Head:    Normocephalic, without obvious abnormality, atraumatic  Eyes:    PERRL, conjunctiva/corneas clear, EOM's intact, fundi    benign, both eyes       Ears:    Normal TM's and external ear canals, both ears  Nose:   Nares normal, septum midline, mucosa normal, no drainage   or sinus tenderness  Throat:   Lips, mucosa, and tongue normal; teeth and gums normal  Neck:   Supple, symmetrical, trachea midline, no adenopathy;       thyroid:  No enlargement/tenderness/nodules; no carotid   bruit or JVD  Back:     Symmetric, no curvature, ROM normal, no CVA tenderness  Lungs:     Clear to auscultation bilaterally, respirations unlabored  Chest wall:    No tenderness or deformity  Heart:    Bradycardic. Normal rhythm. No murmurs, rubs, or gallops.  S1 and S2 normal  Abdomen:     Soft, non-tender, bowel sounds active all four quadrants,    no masses, no organomegaly  Genitalia:    normal, deferred  Rectal:    normal tone, normal prostate, no masses or tenderness  Extremities:   All extremities are intact. No cyanosis or edema  Pulses:   2+ and symmetric all extremities  Skin:   Skin color, texture, turgor normal, no rashes or lesions  Lymph nodes:   Cervical, supraclavicular, and axillary nodes normal  Neurologic:   CNII-XII intact. Normal strength, sensation and reflexes      throughout     Most recent  functional status assessment: In your present state of health, do you have any difficulty performing the following activities: 11/25/2019  Hearing? Y  Vision? N  Difficulty concentrating or making decisions? Y  Walking or climbing stairs? N  Dressing or bathing? N  Doing errands, shopping? N  Some recent data might be hidden   Most recent fall risk assessment: Fall Risk  11/25/2019  Falls in the past year? 1  Number falls in past yr: 1  Injury with Fall? 1  Comment intermittent left rib soreness 2 days ago, playing football  Follow up Falls evaluation completed    Most recent depression screenings: PHQ 2/9 Scores 11/25/2019 11/21/2018  PHQ - 2 Score 1 1  PHQ- 9 Score 3 2   Most recent cognitive screening: No flowsheet data found. Most recent Audit-C alcohol use screening Alcohol Use Disorder Test (AUDIT) 11/25/2019  1. How often do you have a drink containing alcohol? 0  2. How many drinks containing alcohol do you have on a typical day when you are drinking? 0  3. How often do you have six or more drinks on one occasion? 0  AUDIT-C Score 0  Alcohol Brief Interventions/Follow-up AUDIT Score <7 follow-up not indicated   A score of 3 or more in women, and 4 or more in men indicates increased risk for alcohol abuse, EXCEPT if all of the points are from question 1   Results for orders placed or performed in visit on 11/25/19  IFOBT POC (occult bld, rslt in office)  Result Value Ref Range   IFOBT Negative     Assessment & Plan     Annual wellness visit done today including the all of the following: Reviewed patient's Family Medical History Reviewed and updated list of patient's medical providers Assessment of cognitive impairment was done Assessed patient's functional ability Established a written schedule for health screening [place] Completed and Reviewed  Exercise Activities and Dietary recommendations Goals   None     Immunization History    Administered Date(s) Administered  . Pneumococcal Conjugate-13 12/18/2017  . Pneumococcal Polysaccharide-23 11/25/2019  . Td 06/14/1999  . Tdap 02/12/2010    Health Maintenance  Topic Date Due  . Hepatitis C Screening  Never done  . COVID-19 Vaccine (1) 12/11/2019 (Originally 03/17/1962)  . INFLUENZA VACCINE  11/11/2020 (Originally 10/13/2019)  . TETANUS/TDAP  02/13/2020  . COLONOSCOPY  07/28/2024  . PNA vac Low Risk Adult  Completed     Discussed health benefits of physical activity, and encouraged him to engage in regular exercise appropriate for his age and condition.    1. Medicare annual  wellness visit, subsequent  - Lipid panel - CBC w/Diff/Platelet - Comprehensive Metabolic Panel (CMET) - TSH - PSA  2. Annual physical exam   3. Essential hypertension  - Lipid panel - CBC w/Diff/Platelet - Comprehensive Metabolic Panel (CMET) - TSH  4. Hyperlipidemia, unspecified hyperlipidemia type  - Lipid panel - CBC w/Diff/Platelet - Comprehensive Metabolic Panel (CMET) - TSH  5. Need for pneumococcal vaccine  - Pneumococcal polysaccharide vaccine 23-valent greater than or equal to 2yo subcutaneous/IM  6. Chest pain, unspecified type  - Lipid panel - CBC w/Diff/Platelet - Comprehensive Metabolic Panel (CMET) - TSH  7. Benign prostatic hyperplasia, unspecified whether lower urinary tract symptoms present  - PSA  8. Encounter for screening fecal occult blood testing  - IFOBT POC (occult bld, rslt in office); Future - IFOBT POC (occult bld, rslt in office) 9.SKs F/u with dermatology./[place]  Return in about 6 months (around 05/24/2020).     I, Wilhemena Durie, MD, have reviewed all documentation for this visit. The documentation on 11/30/19 for the exam, diagnosis, procedures, and orders are all accurate and complete.    Janeliz Prestwood Cranford Mon, MD  [facility] [phone] (phone) [phone] (fax)  [place]

## 2019-11-25 ENCOUNTER — Encounter: Payer: Self-pay | Admitting: Family Medicine

## 2019-11-25 ENCOUNTER — Ambulatory Visit (INDEPENDENT_AMBULATORY_CARE_PROVIDER_SITE_OTHER): Payer: Medicare HMO | Admitting: Family Medicine

## 2019-11-25 ENCOUNTER — Other Ambulatory Visit: Payer: Self-pay

## 2019-11-25 VITALS — BP 132/79 | HR 58 | Temp 97.9°F | Resp 16 | Ht 68.0 in | Wt 172.0 lb

## 2019-11-25 DIAGNOSIS — I10 Essential (primary) hypertension: Secondary | ICD-10-CM | POA: Diagnosis not present

## 2019-11-25 DIAGNOSIS — E785 Hyperlipidemia, unspecified: Secondary | ICD-10-CM | POA: Diagnosis not present

## 2019-11-25 DIAGNOSIS — R079 Chest pain, unspecified: Secondary | ICD-10-CM

## 2019-11-25 DIAGNOSIS — Z1211 Encounter for screening for malignant neoplasm of colon: Secondary | ICD-10-CM | POA: Diagnosis not present

## 2019-11-25 DIAGNOSIS — Z23 Encounter for immunization: Secondary | ICD-10-CM | POA: Diagnosis not present

## 2019-11-25 DIAGNOSIS — N400 Enlarged prostate without lower urinary tract symptoms: Secondary | ICD-10-CM

## 2019-11-25 DIAGNOSIS — Z0000 Encounter for general adult medical examination without abnormal findings: Secondary | ICD-10-CM

## 2019-11-25 LAB — IFOBT (OCCULT BLOOD): IFOBT: NEGATIVE

## 2019-12-09 DIAGNOSIS — N400 Enlarged prostate without lower urinary tract symptoms: Secondary | ICD-10-CM | POA: Diagnosis not present

## 2019-12-09 DIAGNOSIS — I10 Essential (primary) hypertension: Secondary | ICD-10-CM | POA: Diagnosis not present

## 2019-12-09 DIAGNOSIS — R079 Chest pain, unspecified: Secondary | ICD-10-CM | POA: Diagnosis not present

## 2019-12-09 DIAGNOSIS — Z0000 Encounter for general adult medical examination without abnormal findings: Secondary | ICD-10-CM | POA: Diagnosis not present

## 2019-12-09 DIAGNOSIS — E785 Hyperlipidemia, unspecified: Secondary | ICD-10-CM | POA: Diagnosis not present

## 2019-12-09 NOTE — Progress Notes (Signed)
Subjective:   Zachary Medina is a 69 y.o. male who presents for Medicare Annual/Subsequent preventive examination.  I connected with Lorik Guo today by telephone and verified that I am speaking with the correct person using two identifiers. Location patient: home Location provider: work Persons participating in the virtual visit: patient, provider.   I discussed the limitations, risks, security and privacy concerns of performing an evaluation and management service by telephone and the availability of in person appointments. I also discussed with the patient that there may be a patient responsible charge related to this service. The patient expressed understanding and verbally consented to this telephonic visit.    Interactive audio and video telecommunications were attempted between this provider and patient, however failed, due to patient having technical difficulties OR patient did not have access to video capability.  We continued and completed visit with audio only.   Review of Systems    N/A  Cardiac Risk Factors include: advanced age (>84men, >11 women);male gender     Objective:    There were no vitals filed for this visit. There is no height or weight on file to calculate BMI.  Advanced Directives 12/10/2019 09/01/2014 07/29/2014  Does Patient Have a Medical Advance Directive? Yes No No  Type of Paramedic of [place];Living will - -  Copy of [place] in Chart? No - copy requested - -  Would patient like information on creating a medical advance directive? - - No - patient declined information    Current Medications (verified) Outpatient Encounter Medications as of 12/10/2019  Medication Sig  . Ascorbic Acid (VITAMIN C PO) Take 1 tablet by mouth daily.  Marland Kitchen aspirin 81 MG tablet Take 81 mg by mouth daily.   . Cyanocobalamin (VITAMIN B-12 PO) Take 1 tablet by mouth daily.  . magnesium oxide (MAG-OX) 400 MG tablet Take 1 tablet  (400 mg total) by mouth at bedtime.  Marland Kitchen MAGNESIUM PO Take 1 tablet by mouth daily.  . nitroGLYCERIN (NITROSTAT) 0.4 MG SL tablet Place 1 tablet (0.4 mg total) under the tongue every 5 (five) minutes as needed for up to 25 doses for chest pain.  . Omega 3 1000 MG CAPS Take by mouth daily.   . Vitamin D, Cholecalciferol, 1000 units TABS Take 2,000 Units by mouth daily.   Marland Kitchen amoxicillin (AMOXIL) 875 MG tablet Take 1 tablet (875 mg total) by mouth 2 (two) times daily. (Patient not taking: Reported on 11/25/2019)   No facility-administered encounter medications on file as of 12/10/2019.    Allergies (verified) Patient has no known allergies.   History: Past Medical History:  Diagnosis Date  . GERD (gastroesophageal reflux disease)   . Hepatitis   . Hyperlipidemia   . Kidney stones   . TIA (transient ischemic attack)    Past Surgical History:  Procedure Laterality Date  . CHOLECYSTECTOMY    . COLONOSCOPY N/A 07/29/2014   Procedure: COLONOSCOPY;  Surgeon: Lucilla Lame, MD;  Location: [facility] ENDOSCOPY;  Service: Endoscopy;  Laterality: N/A;  . HERNIA REPAIR     Family History  Problem Relation Age of Onset  . Arthritis Mother   . Glaucoma Mother   . Congestive Heart Failure Father   . Parkinson's disease Father   . Prostate cancer Father   . Arthritis Father   . Heart disease Father   . Diabetes Brother    Social History   Socioeconomic History  . Marital status: Married    Spouse name: Not  on file  . Number of children: 3  . Years of education: Not on file  . Highest education level: Master's degree (e.g., MA, MS, MEng, MEd, MSW, MBA)  Occupational History  . Not on file  Tobacco Use  . Smoking status: Never Smoker  . Smokeless tobacco: Never Used  Vaping Use  . Vaping Use: Never used  Substance and Sexual Activity  . Alcohol use: No  . Drug use: No  . Sexual activity: Not on file  Other Topics Concern  . Not on file  Social History Narrative  . Not on file   Social  Determinants of Health   Financial Resource Strain: Low Risk   . Difficulty of Paying Living Expenses: Not hard at all  Food Insecurity: No Food Insecurity  . Worried About Charity fundraiser in the Last Year: Never true  . Ran Out of Food in the Last Year: Never true  Transportation Needs: No Transportation Needs  . Lack of Transportation (Medical): No  . Lack of Transportation (Non-Medical): No  Physical Activity: Insufficiently Active  . Days of Exercise per Week: 4 days  . Minutes of Exercise per Session: 30 min  Stress: No Stress Concern Present  . Feeling of Stress : Only a little  Social Connections: Socially Integrated  . Frequency of Communication with Friends and Family: More than three times a week  . Frequency of Social Gatherings with Friends and Family: More than three times a week  . Attends Religious Services: More than 4 times per year  . Active Member of Clubs or Organizations: Yes  . Attends Archivist Meetings: More than 4 times per year  . Marital Status: Married    Tobacco Counseling Counseling given: Not Answered   Clinical Intake:  Pre-visit preparation completed: Yes  Pain : No/denies pain     Nutritional Risks: None Diabetes: No  How often do you need to have someone help you when you read instructions, pamphlets, or other written materials from your doctor or pharmacy?: 1 - Never  Diabetic? No  Interpreter Needed?: No  Information entered by :: [facility], LPN   Activities of Daily Living In your present state of health, do you have any difficulty performing the following activities: 12/10/2019 11/25/2019  Hearing? N Y  Comment Has bilateral hearing aids. -  Vision? N N  Difficulty concentrating or making decisions? N Y  Walking or climbing stairs? N N  Dressing or bathing? N N  Doing errands, shopping? N N  Preparing Food and eating ? N -  Using the Toilet? N -  In the past six months, have you accidently leaked urine? N -   Do you have problems with loss of bowel control? N -  Managing your Medications? N -  Managing your Finances? N -  Housekeeping or managing your Housekeeping? N -  Some recent data might be hidden    Patient Care Team: Jerrol Banana., MD as PCP - General (Family Medicine) Rockey Situ, Kathlene November, MD as PCP - Cardiology (Cardiology) Beverly Gust, MD (Otolaryngology) Dwain Sarna, Vincent as Referring Physician (Optometry) Hosie Spangle, MD as Referring Physician (Dermatology) Dasher, Rayvon Char, MD (Dermatology)  Indicate any recent Medical Services you may have received from other than [facility] providers in the past year (date may be approximate).     Assessment:   This is a routine wellness examination for [place].  Hearing/Vision screen No exam data present  Dietary issues and exercise activities discussed:  Current Exercise Habits: Home exercise routine, Type of exercise: walking, Time (Minutes): 30, Frequency (Times/Week): 4, Weekly Exercise (Minutes/Week): 120, Intensity: Mild, Exercise limited by: None identified  Goals    . Prevent falls     Recommend to remove any items from the home that may cause slips or trips.      Depression Screen PHQ 2/9 Scores 11/25/2019 11/21/2018 06/12/2017 10/10/2016 07/01/2015  PHQ - 2 Score 1 1 1 1  0  PHQ- 9 Score 3 2 - - -    Fall Risk Fall Risk  12/10/2019 11/25/2019 06/12/2017 10/10/2016 07/01/2015  Falls in the past year? 1 1 No No No  Number falls in past yr: 1 1 - - -  Injury with Fall? 0 1 - - -  Comment - intermittent left rib soreness 2 days ago, playing football - - -  Follow up Falls prevention discussed Falls evaluation completed - - -    Any stairs in or around the home? Yes  If so, are there any without handrails? No  Home free of loose throw rugs in walkways, pet beds, electrical cords, etc? Yes  Adequate lighting in your home to reduce risk of falls? Yes   ASSISTIVE DEVICES UTILIZED TO PREVENT FALLS:  Life alert? No    Use of a cane, walker or w/c? No  Grab bars in the bathroom? Yes  Shower chair or bench in shower? No  Elevated toilet seat or a handicapped toilet? No    Cognitive Function: Declined today.        Immunizations Immunization History  Administered Date(s) Administered  . Pneumococcal Conjugate-13 12/18/2017  . Pneumococcal Polysaccharide-23 11/25/2019  . Td 06/14/1999  . Tdap 02/12/2010    TDAP status: Up to date Flu Vaccine status: Declined, Education has been provided regarding the importance of this vaccine but patient still declined. Advised may receive this vaccine at local pharmacy or Health Dept. Aware to provide a copy of the vaccination record if obtained from local pharmacy or Health Dept. Verbalized acceptance and understanding. Pneumococcal vaccine status: Up to date Covid-19 vaccine status: Declined, Education has been provided regarding the importance of this vaccine but patient still declined. Advised may receive this vaccine at local pharmacy or Health Dept.or vaccine clinic. Aware to provide a copy of the vaccination record if obtained from local pharmacy or Health Dept. Verbalized acceptance and understanding.  Qualifies for Shingles Vaccine? Yes   Zostavax completed No   Shingrix Completed?: No.    Education has been provided regarding the importance of this vaccine. Patient has been advised to call insurance company to determine out of pocket expense if they have not yet received this vaccine. Advised may also receive vaccine at local pharmacy or Health Dept. Verbalized acceptance and understanding.  Screening Tests Health Maintenance  Topic Date Due  . Hepatitis C Screening  Never done  . COVID-19 Vaccine (1) 12/11/2019 (Originally 03/17/1962)  . INFLUENZA VACCINE  11/11/2020 (Originally 10/13/2019)  . TETANUS/TDAP  02/13/2020  . COLONOSCOPY  07/28/2024  . PNA vac Low Risk Adult  Completed    Health Maintenance  Health Maintenance Due  Topic Date Due  .  Hepatitis C Screening  Never done    Colorectal cancer screening: Completed 07/29/14. Repeat every 10 years  Lung Cancer Screening: (Low Dose CT Chest recommended if Age 54-80 years, 30 pack-year currently smoking OR have quit w/in 15years.) does not qualify.   Additional Screening:  Hepatitis C Screening: does qualify; however declined order at this time.  Vision Screening: Recommended annual ophthalmology exams for early detection of glaucoma and other disorders of the eye. Is the patient up to date with their annual eye exam?  Yes  Who is the provider or what is the name of the office in which the patient attends annual eye exams? Dr Ellin Mayhew. If pt is not established with a provider, would they like to be referred to a provider to establish care? No .   Dental Screening: Recommended annual dental exams for proper oral hygiene  Community Resource Referral / Chronic Care Management: CRR required this visit?  No   CCM required this visit?  No      Plan:     I have personally reviewed and noted the following in the patient's chart:   . Medical and social history . Use of alcohol, tobacco or illicit drugs  . Current medications and supplements . Functional ability and status . Nutritional status . Physical activity . Advanced directives . List of other physicians . Hospitalizations, surgeries, and ER visits in previous 12 months . Vitals . Screenings to include cognitive, depression, and falls . Referrals and appointments  In addition, I have reviewed and discussed with patient certain preventive protocols, quality metrics, and best practice recommendations. A written personalized care plan for preventive services as well as general preventive health recommendations were provided to patient.     Louna Rothgeb Zimmerman, [place]   9[id number]   Nurse Notes: Pt declined receiving a Hep C lab order, flu shot or Covid vaccine at this time.

## 2019-12-10 ENCOUNTER — Other Ambulatory Visit: Payer: Self-pay

## 2019-12-10 ENCOUNTER — Ambulatory Visit (INDEPENDENT_AMBULATORY_CARE_PROVIDER_SITE_OTHER): Payer: Medicare HMO

## 2019-12-10 DIAGNOSIS — Z0000 Encounter for general adult medical examination without abnormal findings: Secondary | ICD-10-CM

## 2019-12-10 LAB — COMPREHENSIVE METABOLIC PANEL
ALT: 19 IU/L (ref 0–44)
AST: 21 IU/L (ref 0–40)
Albumin/Globulin Ratio: 1.8 (ref 1.2–2.2)
Albumin: 4.3 g/dL (ref 3.8–4.8)
Alkaline Phosphatase: 73 IU/L (ref 44–121)
BUN/Creatinine Ratio: 14 (ref 10–24)
BUN: 16 mg/dL (ref 8–27)
Bilirubin Total: 1.7 mg/dL — ABNORMAL HIGH (ref 0.0–1.2)
CO2: 25 mmol/L (ref 20–29)
Calcium: 9.3 mg/dL (ref 8.6–10.2)
Chloride: 106 mmol/L (ref 96–106)
Creatinine, Ser: 1.14 mg/dL (ref 0.76–1.27)
GFR calc Af Amer: 75 mL/min/{1.73_m2} (ref >59)
GFR calc non Af Amer: 65 mL/min/{1.73_m2} (ref >59)
Globulin, Total: 2.4 g/dL (ref 1.5–4.5)
Glucose: 92 mg/dL (ref 65–99)
Potassium: 4.3 mmol/L (ref 3.5–5.2)
Sodium: 141 mmol/L (ref 134–144)
Total Protein: 6.7 g/dL (ref 6.0–8.5)

## 2019-12-10 LAB — CBC WITH DIFFERENTIAL/PLATELET
Basophils Absolute: 0 10*3/uL (ref 0.0–0.2)
Basos: 1 %
EOS (ABSOLUTE): 0.1 10*3/uL (ref 0.0–0.4)
Eos: 3 %
Hematocrit: 47.8 % (ref 37.5–51.0)
Hemoglobin: 16 g/dL (ref 13.0–17.7)
Immature Grans (Abs): 0 10*3/uL (ref 0.0–0.1)
Immature Granulocytes: 0 %
Lymphocytes Absolute: 1.2 10*3/uL (ref 0.7–3.1)
Lymphs: 28 %
MCH: 30.9 pg (ref 26.6–33.0)
MCHC: 33.5 g/dL (ref 31.5–35.7)
MCV: 92 fL (ref 79–97)
Monocytes Absolute: 0.5 10*3/uL (ref 0.1–0.9)
Monocytes: 11 %
Neutrophils Absolute: 2.4 10*3/uL (ref 1.4–7.0)
Neutrophils: 57 %
Platelets: 147 10*3/uL — ABNORMAL LOW (ref 150–450)
RBC: 5.18 x10E6/uL (ref 4.14–5.80)
RDW: 12.8 % (ref 11.6–15.4)
WBC: 4.1 10*3/uL (ref 3.4–10.8)

## 2019-12-10 LAB — LIPID PANEL
Chol/HDL Ratio: 5.4 ratio — ABNORMAL HIGH (ref 0.0–5.0)
Cholesterol, Total: 177 mg/dL (ref 100–199)
HDL: 33 mg/dL — ABNORMAL LOW (ref >39)
LDL Chol Calc (NIH): 119 mg/dL — ABNORMAL HIGH (ref 0–99)
Triglycerides: 137 mg/dL (ref 0–149)
VLDL Cholesterol Cal: 25 mg/dL (ref 5–40)

## 2019-12-10 LAB — TSH: TSH: 1.95 u[IU]/mL (ref 0.450–4.500)

## 2019-12-10 LAB — PSA: Prostate Specific Ag, Serum: 3.5 ng/mL (ref 0.0–4.0)

## 2019-12-10 NOTE — Patient Instructions (Signed)
Zachary Medina , Thank you for taking time to come for your Medicare Wellness Visit. I appreciate your ongoing commitment to your health goals. Please review the following plan we discussed and let me know if I can assist you in the future.   Screening recommendations/referrals: Colonoscopy: Up to date, due 07/2024 Recommended yearly ophthalmology/optometry visit for glaucoma screening and checkup Recommended yearly dental visit for hygiene and checkup  Vaccinations: Influenza vaccine: Currently due Pneumococcal vaccine: Completed series Tdap vaccine: Up to date, due 02/2020 Shingles vaccine: Shingrix discussed. Please contact your pharmacy for coverage information.     Advanced directives: Please bring a copy of your POA (Power of Attorney) and/or Living Will to your next appointment.   Conditions/risks identified: Fall risk preventatives discussed today.  Next appointment: 05/26/19 @ 8:40 AM with Dr Rosanna Randy   Preventive Care 70 Years and Older, Male Preventive care refers to lifestyle choices and visits with your health care provider that can promote health and wellness. What does preventive care include?  A yearly physical exam. This is also called an annual well check.  Dental exams once or twice a year.  Routine eye exams. Ask your health care provider how often you should have your eyes checked.  Personal lifestyle choices, including:  Daily care of your teeth and gums.  Regular physical activity.  Eating a healthy diet.  Avoiding tobacco and drug use.  Limiting alcohol use.  Practicing safe sex.  Taking low doses of aspirin every day.  Taking vitamin and mineral supplements as recommended by your health care provider. What happens during an annual well check? The services and screenings done by your health care provider during your annual well check will depend on your age, overall health, lifestyle risk factors, and family history of disease. Counseling  Your  health care provider may ask you questions about your:  Alcohol use.  Tobacco use.  Drug use.  Emotional well-being.  Home and relationship well-being.  Sexual activity.  Eating habits.  History of falls.  Memory and ability to understand (cognition).  Work and work Statistician. Screening  You may have the following tests or measurements:  Height, weight, and BMI.  Blood pressure.  Lipid and cholesterol levels. These may be checked every 5 years, or more frequently if you are over 56 years old.  Skin check.  Lung cancer screening. You may have this screening every year starting at age 43 if you have a 30-pack-year history of smoking and currently smoke or have quit within the past 15 years.  Fecal occult blood test (FOBT) of the stool. You may have this test every year starting at age 21.  Flexible sigmoidoscopy or colonoscopy. You may have a sigmoidoscopy every 5 years or a colonoscopy every 10 years starting at age 58.  Prostate cancer screening. Recommendations will vary depending on your family history and other risks.  Hepatitis C blood test.  Hepatitis B blood test.  Sexually transmitted disease (STD) testing.  Diabetes screening. This is done by checking your blood sugar (glucose) after you have not eaten for a while (fasting). You may have this done every 1-3 years.  Abdominal aortic aneurysm (AAA) screening. You may need this if you are a current or former smoker.  Osteoporosis. You may be screened starting at age 66 if you are at high risk. Talk with your health care provider about your test results, treatment options, and if necessary, the need for more tests. Vaccines  Your health care provider may recommend certain  vaccines, such as:  Influenza vaccine. This is recommended every year.  Tetanus, diphtheria, and acellular pertussis (Tdap, Td) vaccine. You may need a Td booster every 10 years.  Zoster vaccine. You may need this after age  59.  Pneumococcal 13-valent conjugate (PCV13) vaccine. One dose is recommended after age 59.  Pneumococcal polysaccharide (PPSV23) vaccine. One dose is recommended after age 21. Talk to your health care provider about which screenings and vaccines you need and how often you need them. This information is not intended to replace advice given to you by your health care provider. Make sure you discuss any questions you have with your health care provider. Document Released: 03/27/2015 Document Revised: 11/18/2015 Document Reviewed: 12/30/2014 Elsevier Interactive Patient Education  2017 [place] Prevention in the Home Falls can cause injuries. They can happen to people of all ages. There are many things you can do to make your home safe and to help prevent falls. What can I do on the outside of my home?  Regularly fix the edges of walkways and driveways and fix any cracks.  Remove anything that might make you trip as you walk through a door, such as a raised step or threshold.  Trim any bushes or trees on the path to your home.  Use bright outdoor lighting.  Clear any walking paths of anything that might make someone trip, such as rocks or tools.  Regularly check to see if handrails are loose or broken. Make sure that both sides of any steps have handrails.  Any raised decks and porches should have guardrails on the edges.  Have any leaves, snow, or ice cleared regularly.  Use sand or salt on walking paths during winter.  Clean up any spills in your garage right away. This includes oil or grease spills. What can I do in the bathroom?  Use night lights.  Install grab bars by the toilet and in the tub and shower. Do not use towel bars as grab bars.  Use non-skid mats or decals in the tub or shower.  If you need to sit down in the shower, use a plastic, non-slip stool.  Keep the floor dry. Clean up any water that spills on the floor as soon as it happens.  Remove  soap buildup in the tub or shower regularly.  Attach bath mats securely with double-sided non-slip rug tape.  Do not have throw rugs and other things on the floor that can make you trip. What can I do in the bedroom?  Use night lights.  Make sure that you have a light by your bed that is easy to reach.  Do not use any sheets or blankets that are too big for your bed. They should not hang down onto the floor.  Have a firm chair that has side arms. You can use this for support while you get dressed.  Do not have throw rugs and other things on the floor that can make you trip. What can I do in the kitchen?  Clean up any spills right away.  Avoid walking on wet floors.  Keep items that you use a lot in easy-to-reach places.  If you need to reach something above you, use a strong step stool that has a grab bar.  Keep electrical cords out of the way.  Do not use floor polish or wax that makes floors slippery. If you must use wax, use non-skid floor wax.  Do not have throw rugs and other things  on the floor that can make you trip. What can I do with my stairs?  Do not leave any items on the stairs.  Make sure that there are handrails on both sides of the stairs and use them. Fix handrails that are broken or loose. Make sure that handrails are as long as the stairways.  Check any carpeting to make sure that it is firmly attached to the stairs. Fix any carpet that is loose or worn.  Avoid having throw rugs at the top or bottom of the stairs. If you do have throw rugs, attach them to the floor with carpet tape.  Make sure that you have a light switch at the top of the stairs and the bottom of the stairs. If you do not have them, ask someone to add them for you. What else can I do to help prevent falls?  Wear shoes that:  Do not have high heels.  Have rubber bottoms.  Are comfortable and fit you well.  Are closed at the toe. Do not wear sandals.  If you use a  stepladder:  Make sure that it is fully opened. Do not climb a closed stepladder.  Make sure that both sides of the stepladder are locked into place.  Ask someone to hold it for you, if possible.  Clearly mark and make sure that you can see:  Any grab bars or handrails.  First and last steps.  Where the edge of each step is.  Use tools that help you move around (mobility aids) if they are needed. These include:  Canes.  Walkers.  Scooters.  Crutches.  Turn on the lights when you go into a dark area. Replace any light bulbs as soon as they burn out.  Set up your furniture so you have a clear path. Avoid moving your furniture around.  If any of your floors are uneven, fix them.  If there are any pets around you, be aware of where they are.  Review your medicines with your doctor. Some medicines can make you feel dizzy. This can increase your chance of falling. Ask your doctor what other things that you can do to help prevent falls. This information is not intended to replace advice given to you by your health care provider. Make sure you discuss any questions you have with your health care provider. Document Released: 12/25/2008 Document Revised: 08/06/2015 Document Reviewed: 04/04/2014 Elsevier Interactive Patient Education  2017 [employer].

## 2019-12-12 ENCOUNTER — Telehealth: Payer: Self-pay

## 2019-12-12 NOTE — Telephone Encounter (Signed)
Unable to reach patient at this time vcoicemailbox is full. KW

## 2019-12-12 NOTE — Telephone Encounter (Signed)
-----   Message from Jerrol Banana., MD sent at 12/12/2019  9:10 AM EDT ----- Labs all stable.

## 2019-12-17 NOTE — Telephone Encounter (Signed)
Seen by patient Zachary Medina on 12/17/2019 9:57 AM

## 2019-12-25 DIAGNOSIS — G4733 Obstructive sleep apnea (adult) (pediatric): Secondary | ICD-10-CM | POA: Diagnosis not present

## 2019-12-26 DIAGNOSIS — Z20822 Contact with and (suspected) exposure to covid-19: Secondary | ICD-10-CM | POA: Diagnosis not present

## 2020-03-03 DIAGNOSIS — R69 Illness, unspecified: Secondary | ICD-10-CM | POA: Diagnosis not present

## 2020-04-07 ENCOUNTER — Ambulatory Visit (INDEPENDENT_AMBULATORY_CARE_PROVIDER_SITE_OTHER): Payer: Medicare HMO | Admitting: Family Medicine

## 2020-04-07 ENCOUNTER — Other Ambulatory Visit: Payer: Self-pay

## 2020-04-07 VITALS — BP 147/79 | HR 56 | Temp 97.9°F | Wt 174.0 lb

## 2020-04-07 DIAGNOSIS — M5441 Lumbago with sciatica, right side: Secondary | ICD-10-CM | POA: Diagnosis not present

## 2020-04-07 DIAGNOSIS — M722 Plantar fascial fibromatosis: Secondary | ICD-10-CM

## 2020-04-07 DIAGNOSIS — M25551 Pain in right hip: Secondary | ICD-10-CM | POA: Diagnosis not present

## 2020-04-07 DIAGNOSIS — G8929 Other chronic pain: Secondary | ICD-10-CM

## 2020-04-07 MED ORDER — NITROGLYCERIN 0.4 MG SL SUBL: 0.4000 mg | SUBLINGUAL_TABLET | SUBLINGUAL | 4 refills | Status: DC | PRN

## 2020-04-07 MED ORDER — MELOXICAM 15 MG PO TABS
15.0000 mg | ORAL_TABLET | Freq: Every day | ORAL | 3 refills | Status: DC
Start: 2020-04-07 — End: 2020-12-22

## 2020-04-07 NOTE — Patient Instructions (Signed)
Try OTC Turmeric for foot pain.

## 2020-04-07 NOTE — Progress Notes (Signed)
Established patient visit   Patient: Zachary Medina   DOB: February 13, 1951   70 y.o. Male  MRN: [id number] Visit Date: 04/07/2020  Today's healthcare provider: Wilhemena Durie, MD   Chief Complaint  Patient presents with  . Hip Pain  . Leg Pain   Subjective    Hip Pain  Incident onset: Pain started about 4-6 weeks ago.  There was no injury mechanism. The pain is present in the right hip and right thigh. The quality of the pain is described as aching. The pain has been fluctuating since onset. Pertinent negatives include no inability to bear weight, loss of motion, loss of sensation, muscle weakness, numbness or tingling. He has tried nothing for the symptoms.      Pt would also like a referral to a podiatrist for left foot pain.  Possible plantars wart.  Some pain with weightbearing in the foot.   Patient Active Problem List   Diagnosis Date Noted  . Irregular heartbeat 07/11/2017  . Palpitations 05/21/2017  . Gastroenteritis 04/26/2016  . Allergic rhinitis 07/16/2014  . Acid reflux 07/16/2014  . 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 H/O renal calculi 07/16/2014  . H/O transient cerebral ischemia 07/16/2014  . Obstructive apnea 07/16/2014  . HLD (hyperlipidemia) 07/16/2014  . Avitaminosis D 07/16/2014   Past Medical History:  Diagnosis Date  . GERD (gastroesophageal reflux disease)   . Hepatitis   . Hyperlipidemia   . Kidney stones   . TIA (transient ischemic attack)    Social History   Tobacco Use  . Smoking status: Never Smoker  . Smokeless tobacco: Never Used  Vaping Use  . Vaping Use: Never used  Substance Use Topics  . Alcohol use: No  . Drug use: No   No Known Allergies     Medications: Outpatient Medications Prior to Visit  Medication Sig  . Ascorbic Acid (VITAMIN C PO) Take 1 tablet by mouth daily.  Marland Kitchen aspirin 81 MG tablet Take 81 mg by mouth daily.   . Cyanocobalamin (VITAMIN B-12 PO) Take 1 tablet by mouth daily.  Marland Kitchen MAGNESIUM PO Take 1  tablet by mouth daily.  . nitroGLYCERIN (NITROSTAT) 0.4 MG SL tablet Place 1 tablet (0.4 mg total) under the tongue every 5 (five) minutes as needed for up to 25 doses for chest pain.  . Omega 3 1000 MG CAPS Take by mouth daily.   . Vitamin D, Cholecalciferol, 1000 units TABS Take 2,000 Units by mouth daily.   Marland Kitchen amoxicillin (AMOXIL) 875 MG tablet Take 1 tablet (875 mg total) by mouth 2 (two) times daily.  . magnesium oxide (MAG-OX) 400 MG tablet Take 1 tablet (400 mg total) by mouth at bedtime.   No facility-administered medications prior to visit.    Review of Systems  Constitutional: Negative.   Musculoskeletal: Positive for arthralgias and joint swelling. Negative for back pain, gait problem, myalgias, neck pain and neck stiffness.  Neurological: Negative for tingling and numbness.       Objective    BP (!) 147/79 (BP Location: Left Arm, Patient Position: Sitting, Cuff Size: Large)   Pulse (!) 56   Temp 97.9 F (36.6 C) (Oral)   Wt 174 lb (78.9 kg)   BMI 26.46 kg/m     Physical Exam Vitals reviewed.  Constitutional:      Appearance: He is well-developed.  HENT:     Head: Normocephalic and atraumatic.     Right Ear: External ear normal.  Left Ear: External ear normal.     Nose: Nose normal.  Eyes:     General: No scleral icterus.    Conjunctiva/sclera: Conjunctivae normal.  Neck:     Thyroid: No thyromegaly.  Cardiovascular:     Rate and Rhythm: Normal rate and regular rhythm.     Heart sounds: Normal heart sounds.  Pulmonary:     Effort: Pulmonary effort is normal.     Breath sounds: Normal breath sounds.  Abdominal:     Palpations: Abdomen is soft.  Musculoskeletal:     Comments: Straight leg raise is negative on both sides.  Right is normal.  DTRs are symmetrical. No tenderness in the foot.  No obvious calluses.  Skin:    General: Skin is warm and dry.  Neurological:     General: No focal deficit present.     Mental Status: He is alert and oriented to  person, place, and time.  Psychiatric:        Behavior: Behavior normal.        Thought Content: Thought content normal.        Judgment: Judgment normal.       No results found for any visits on 04/07/20.  Assessment & Plan      1. Right hip pain This could be arthritis of the hip or low back problem. Try Mobic 15 mg daily x-ray hip and back and try over-the-counter turmeric turmeric daily - DG Lumbar Spine Complete - DG Hip Unilat W OR W/O Pelvis 2-3 Views Right  2. Chronic right-sided low back pain with right-sided sciatica   3. Plantar fasciitis of left foot Consider referral to podiatry.  No follow-ups on file.      I, Wilhemena Durie, MD, have reviewed all documentation for this visit. The documentation on 04/11/20 for the exam, diagnosis, procedures, and orders are all accurate and complete.    Richard Cranford Mon, MD  [facility] [phone] (phone) [phone] (fax)  [place]

## 2020-05-22 NOTE — Progress Notes (Signed)
Established patient visit   Patient: Zachary Medina   DOB: [birth year]   70 y.o. Male  MRN: [id number] Visit Date: 05/25/2020  Today's healthcare provider: Wilhemena Durie, MD   Chief Complaint  Patient presents with  . Hypertension  . Hip Pain   Subjective    HPI  Patient is feeling well and is pain-free.  He is stretching 10 to 15 minutes every morning and is doing well with this.  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  He has this week off. Right hip pain From 04/07/2020-This could be arthritis of the hip or low back problem. Try Mobic 15 mg daily. Try over-the-counter turmeric turmeric daily. X-ray of hip and back ordered-but were done.  Hypertension, follow-up  BP Readings from Last 3 Encounters:  05/25/20 137/84  04/07/20 (!) 147/79  11/25/19 132/79   Wt Readings from Last 3 Encounters:  05/25/20 174 lb (78.9 kg)  04/07/20 174 lb (78.9 kg)  11/25/19 172 lb (78 kg)     He was last seen for hypertension 6 months ago.  BP at that visit was 132/79. Management since that visit includes no medication changes.  He reports good compliance with treatment. He is not having side effects.  He is following a Regular diet. He is not exercising. He does not smoke.  Use of agents associated with hypertension: none.   Outside blood pressures are not being checked. Symptoms: No chest pain No chest pressure  No palpitations No syncope  No dyspnea No orthopnea  No paroxysmal nocturnal dyspnea No lower extremity edema   Pertinent labs: Lab Results  Component Value Date   CHOL 177 12/09/2019   HDL 33 (L) 12/09/2019   LDLCALC 119 (H) 12/09/2019   TRIG 137 12/09/2019   CHOLHDL 5.4 (H) 12/09/2019   Lab Results  Component Value Date   NA 141 12/09/2019   K 4.3 12/09/2019   CREATININE 1.14 12/09/2019   GFRNONAA 65 12/09/2019   GFRAA 75 12/09/2019   GLUCOSE 92 12/09/2019     The 10-year ASCVD risk score Mikey Bussing DC Jr., et al., 2013) is: 26%    --------------------------------------------------------------------       Medications: Outpatient Medications Prior to Visit  Medication Sig  . amoxicillin (AMOXIL) 875 MG tablet Take 1 tablet (875 mg total) by mouth 2 (two) times daily.  . Ascorbic Acid (VITAMIN C PO) Take 1 tablet by mouth daily.  Marland Kitchen aspirin 81 MG tablet Take 81 mg by mouth daily.   . Cyanocobalamin (VITAMIN B-12 PO) Take 1 tablet by mouth daily.  . magnesium oxide (MAG-OX) 400 MG tablet Take 1 tablet (400 mg total) by mouth at bedtime.  Marland Kitchen MAGNESIUM PO Take 1 tablet by mouth daily.  . meloxicam (MOBIC) 15 MG tablet Take 1 tablet (15 mg total) by mouth daily.  . nitroGLYCERIN (NITROSTAT) 0.4 MG SL tablet Place 1 tablet (0.4 mg total) under the tongue every 5 (five) minutes as needed for up to 25 doses for chest pain.  . Omega 3 1000 MG CAPS Take by mouth daily.   . Vitamin D, Cholecalciferol, 1000 units TABS Take 2,000 Units by mouth daily.    No facility-administered medications prior to visit.    Review of Systems  Constitutional: Negative for appetite change, chills and fever.  Respiratory: Negative for chest tightness, shortness of breath and wheezing.   Cardiovascular: Negative for chest pain and palpitations.  Gastrointestinal: Negative for abdominal pain, nausea and vomiting.  Objective    BP 137/84   Pulse (!) 55   Temp 98.4 F (36.9 C)   Resp 16   Ht 5\' 8"  (1.727 m)   Wt 174 lb (78.9 kg)   BMI 26.46 kg/m  BP Readings from Last 3 Encounters:  05/25/20 137/84  04/07/20 (!) 147/79  11/25/19 132/79   Wt Readings from Last 3 Encounters:  05/25/20 174 lb (78.9 kg)  04/07/20 174 lb (78.9 kg)  11/25/19 172 lb (78 kg)       Physical Exam Vitals reviewed.  Constitutional:      Appearance: He is well-developed.  HENT:     Head: Normocephalic and atraumatic.     Right Ear: External ear normal.     Left Ear: External ear normal.     Nose: Nose normal.  Eyes:     General: No  scleral icterus.    Conjunctiva/sclera: Conjunctivae normal.  Neck:     Thyroid: No thyromegaly.  Cardiovascular:     Rate and Rhythm: Normal rate and regular rhythm.     Heart sounds: Normal heart sounds.  Pulmonary:     Effort: Pulmonary effort is normal.     Breath sounds: Normal breath sounds.  Abdominal:     Palpations: Abdomen is soft.  Skin:    General: Skin is warm and dry.  Neurological:     General: No focal deficit present.     Mental Status: He is alert and oriented to person, place, and time.  Psychiatric:        Mood and Affect: Mood normal.        Behavior: Behavior normal.        Thought Content: Thought content normal.        Judgment: Judgment normal.       No results found for any visits on 05/25/20.  Assessment & Plan     1. Essential hypertension Good control.  Of note is patient had a coronary calcium CT score of 0 last year I will see him back this fall.  Continue regular stretching but also do aerobic exercise with walking daily 2. Chronic right-sided low back pain with right-sided sciatica Improved with stretching.    Return in about 7 months (around 12/25/2020).      I, Wilhemena Durie, MD, have reviewed all documentation for this visit. The documentation on 05/25/20 for the exam, diagnosis, procedures, and orders are all accurate and complete.    Sieara Bremer Cranford Mon, MD  [facility] [phone] (phone) [phone] (fax)  [place]

## 2020-05-25 ENCOUNTER — Other Ambulatory Visit: Payer: Self-pay

## 2020-05-25 ENCOUNTER — Encounter: Payer: Self-pay | Admitting: Family Medicine

## 2020-05-25 ENCOUNTER — Ambulatory Visit (INDEPENDENT_AMBULATORY_CARE_PROVIDER_SITE_OTHER): Payer: Medicare HMO | Admitting: Family Medicine

## 2020-05-25 VITALS — BP 137/84 | HR 55 | Temp 98.4°F | Resp 16 | Ht 68.0 in | Wt 174.0 lb

## 2020-05-25 DIAGNOSIS — I10 Essential (primary) hypertension: Secondary | ICD-10-CM | POA: Diagnosis not present

## 2020-05-25 DIAGNOSIS — M5441 Lumbago with sciatica, right side: Secondary | ICD-10-CM

## 2020-05-25 DIAGNOSIS — G8929 Other chronic pain: Secondary | ICD-10-CM

## 2020-05-25 NOTE — Patient Instructions (Addendum)
Only take meloxicam as needed for pain.

## 2020-06-09 NOTE — Progress Notes (Signed)
This encounter was created in error - please disregard.

## 2020-07-15 ENCOUNTER — Emergency Department (HOSPITAL_COMMUNITY): Payer: Medicare HMO

## 2020-07-15 ENCOUNTER — Observation Stay (HOSPITAL_COMMUNITY)
Admission: EM | Admit: 2020-07-15 | Discharge: 2020-07-16 | Disposition: A | Discharge status: Home / Self Care | Payer: Medicare HMO | Attending: Internal Medicine | Admitting: Internal Medicine

## 2020-07-15 ENCOUNTER — Inpatient Hospital Stay (HOSPITAL_COMMUNITY): Payer: Medicare HMO

## 2020-07-15 ENCOUNTER — Ambulatory Visit: Payer: Self-pay | Admitting: *Deleted

## 2020-07-15 ENCOUNTER — Other Ambulatory Visit: Payer: Self-pay

## 2020-07-15 DIAGNOSIS — Z7982 Long term (current) use of aspirin: Secondary | ICD-10-CM | POA: Diagnosis not present

## 2020-07-15 DIAGNOSIS — Z20822 Contact with and (suspected) exposure to covid-19: Secondary | ICD-10-CM | POA: Insufficient documentation

## 2020-07-15 DIAGNOSIS — G459 Transient cerebral ischemic attack, unspecified: Secondary | ICD-10-CM | POA: Diagnosis not present

## 2020-07-15 DIAGNOSIS — I639 Cerebral infarction, unspecified: Secondary | ICD-10-CM | POA: Diagnosis not present

## 2020-07-15 DIAGNOSIS — R001 Bradycardia, unspecified: Secondary | ICD-10-CM | POA: Diagnosis not present

## 2020-07-15 LAB — COMPREHENSIVE METABOLIC PANEL
ALT: 22 U/L (ref 0–44)
AST: 21 U/L (ref 15–41)
Albumin: 3.8 g/dL (ref 3.5–5.0)
Alkaline Phosphatase: 53 U/L (ref 38–126)
Anion gap: 6 (ref 5–15)
BUN: 21 mg/dL (ref 8–23)
CO2: 27 mmol/L (ref 22–32)
Calcium: 9 mg/dL (ref 8.9–10.3)
Chloride: 105 mmol/L (ref 98–111)
Creatinine, Ser: 1.28 mg/dL — ABNORMAL HIGH (ref 0.61–1.24)
GFR, Estimated: >60 mL/min (ref >60)
Glucose, Bld: 129 mg/dL — ABNORMAL HIGH (ref 70–99)
Potassium: 4.2 mmol/L (ref 3.5–5.1)
Sodium: 138 mmol/L (ref 135–145)
Total Bilirubin: 2.2 mg/dL — ABNORMAL HIGH (ref 0.3–1.2)
Total Protein: 6.4 g/dL — ABNORMAL LOW (ref 6.5–8.1)

## 2020-07-15 LAB — CBC
HCT: 48 % (ref 39.0–52.0)
Hemoglobin: 15.7 g/dL (ref 13.0–17.0)
MCH: 31.1 pg (ref 26.0–34.0)
MCHC: 32.7 g/dL (ref 30.0–36.0)
MCV: 95 fL (ref 80.0–100.0)
Platelets: 140 10*3/uL — ABNORMAL LOW (ref 150–400)
RBC: 5.05 MIL/uL (ref 4.22–5.81)
RDW: 13.5 % (ref 11.5–15.5)
WBC: 4.3 10*3/uL (ref 4.0–10.5)
nRBC: 0 % (ref 0.0–0.2)

## 2020-07-15 LAB — DIFFERENTIAL
Abs Immature Granulocytes: 0.01 10*3/uL (ref 0.00–0.07)
Basophils Absolute: 0 10*3/uL (ref 0.0–0.1)
Basophils Relative: 1 %
Eosinophils Absolute: 0.2 10*3/uL (ref 0.0–0.5)
Eosinophils Relative: 4 %
Immature Granulocytes: 0 %
Lymphocytes Relative: 23 %
Lymphs Abs: 1 10*3/uL (ref 0.7–4.0)
Monocytes Absolute: 0.5 10*3/uL (ref 0.1–1.0)
Monocytes Relative: 11 %
Neutro Abs: 2.6 10*3/uL (ref 1.7–7.7)
Neutrophils Relative %: 61 %

## 2020-07-15 LAB — PROTIME-INR
INR: 1 (ref 0.8–1.2)
Prothrombin Time: 13.6 s (ref 11.4–15.2)

## 2020-07-15 LAB — I-STAT CHEM 8, ED
BUN: 21 mg/dL (ref 8–23)
Calcium, Ion: 1.22 mmol/L (ref 1.15–1.40)
Chloride: 104 mmol/L (ref 98–111)
Creatinine, Ser: 1.3 mg/dL — ABNORMAL HIGH (ref 0.61–1.24)
Glucose, Bld: 127 mg/dL — ABNORMAL HIGH (ref 70–99)
HCT: 45 % (ref 39.0–52.0)
Hemoglobin: 15.3 g/dL (ref 13.0–17.0)
Potassium: 4.2 mmol/L (ref 3.5–5.1)
Sodium: 141 mmol/L (ref 135–145)
TCO2: 28 mmol/L (ref 22–32)

## 2020-07-15 LAB — TSH: TSH: 2.052 u[IU]/mL (ref 0.350–4.500)

## 2020-07-15 LAB — CBG MONITORING, ED: Glucose-Capillary: 86 mg/dL (ref 70–99)

## 2020-07-15 LAB — MAGNESIUM: Magnesium: 2.3 mg/dL (ref 1.7–2.4)

## 2020-07-15 LAB — HIV ANTIBODY (ROUTINE TESTING W REFLEX): HIV Screen 4th Generation wRfx: NONREACTIVE

## 2020-07-15 LAB — APTT: aPTT: 31 seconds (ref 24–36)

## 2020-07-15 LAB — PHOSPHORUS: Phosphorus: 3.1 mg/dL (ref 2.5–4.6)

## 2020-07-15 IMAGING — CT CT HEAD W/O CM
4 series · 17 of 47 positions shown, 19 images · non-contrast
Comparison: None.

CLINICAL DATA: Transient ischemic attack (TIA)

EXAM:
CT HEAD WITHOUT CONTRAST
TECHNIQUE: Contiguous axial images were obtained from the base of the skull
through the vertex without intravenous contrast.

[Series 3: head bone · axial · 0.46mm/px · z∈[+1248,+1310]mm · 4 of 89 slices shown]
[im 9/89  bone]
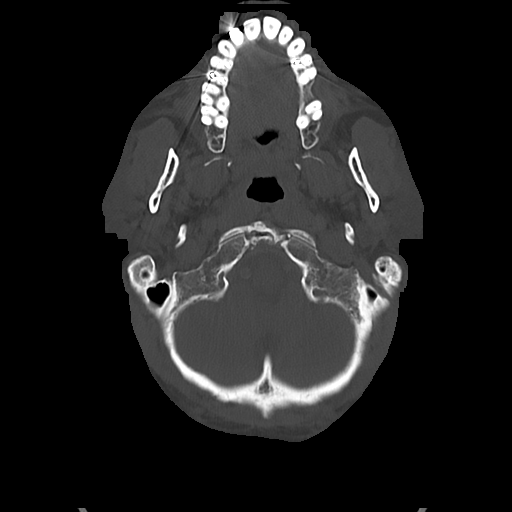
[im 18/89  bone]
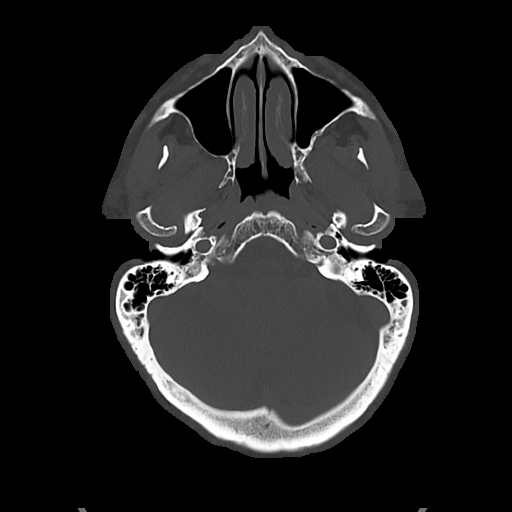
[im 27/89  bone]
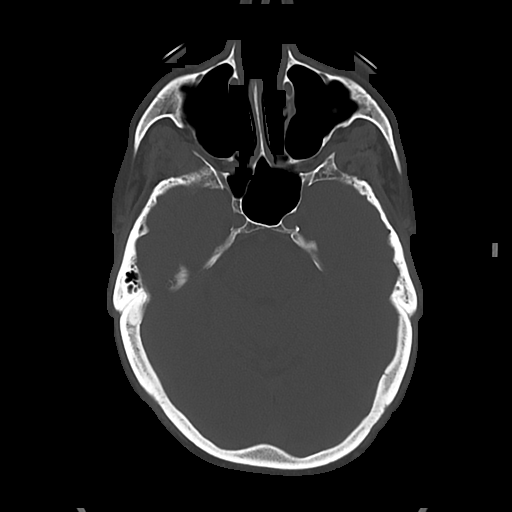
[im 40/89  bone]
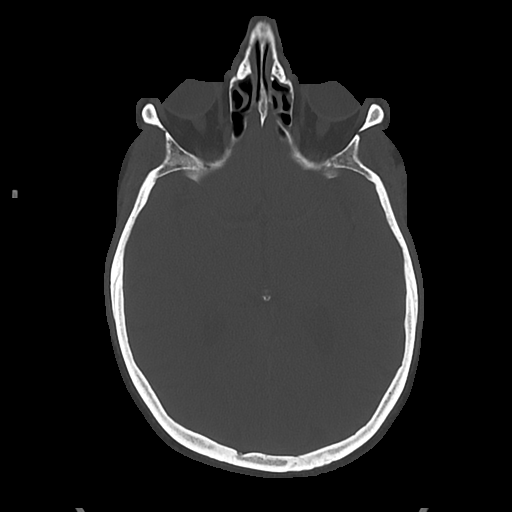

[Series 4: head wo · axial · 0.46mm/px · z∈[+1252,+1382]mm · 7 of 36 slices shown, 9 images]
[im 5/36  brain]
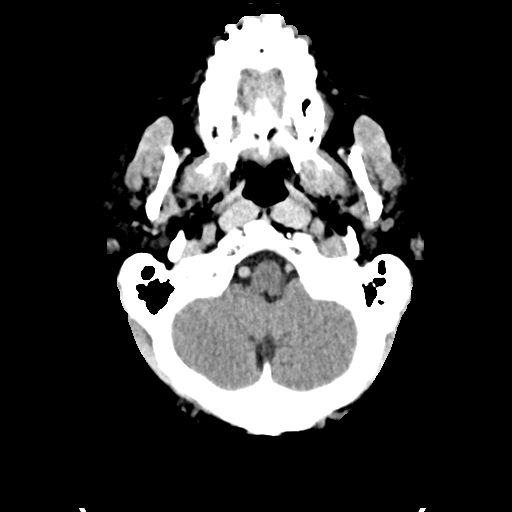
[im 5/36  bone]
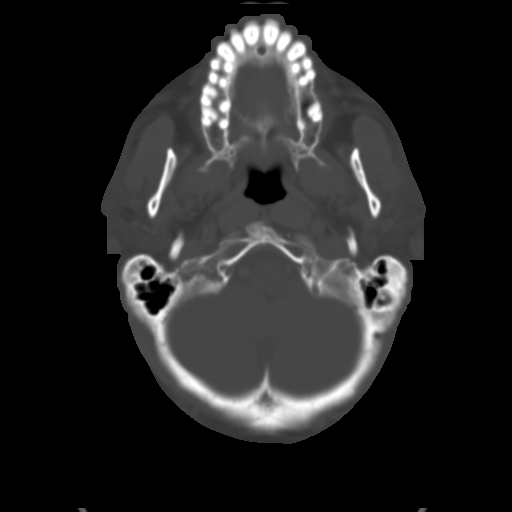
[im 9/36  brain]
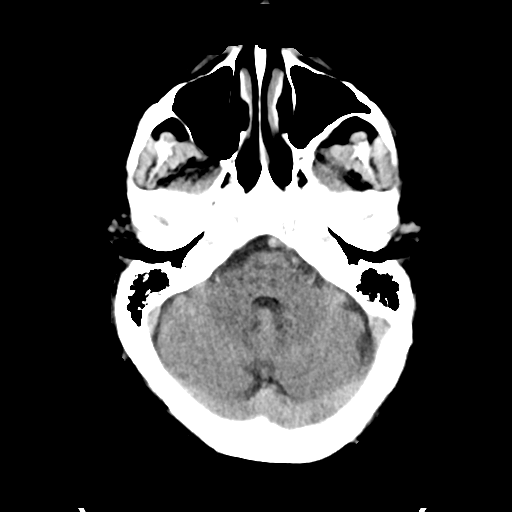
[im 14/36  brain]
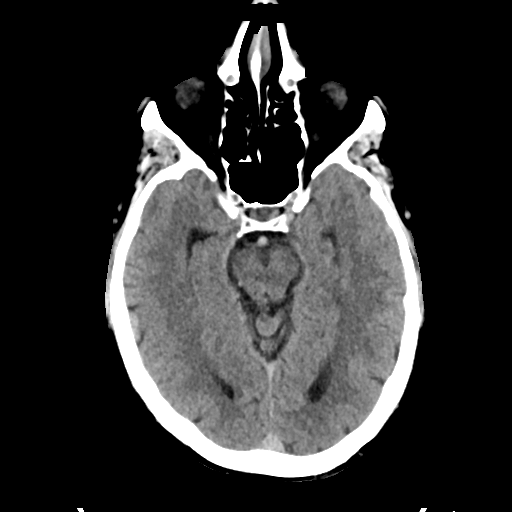
[im 18/36  brain]
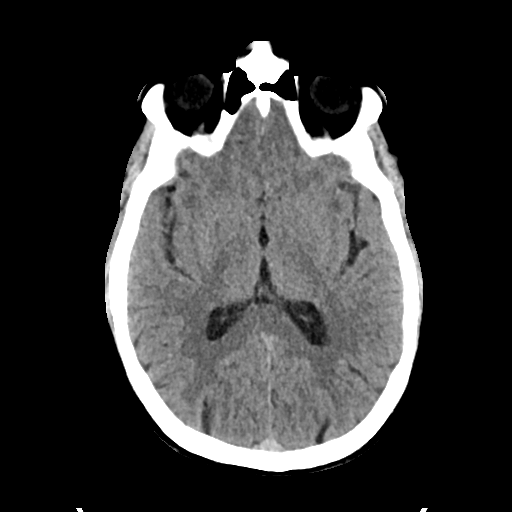
[im 22/36  brain]
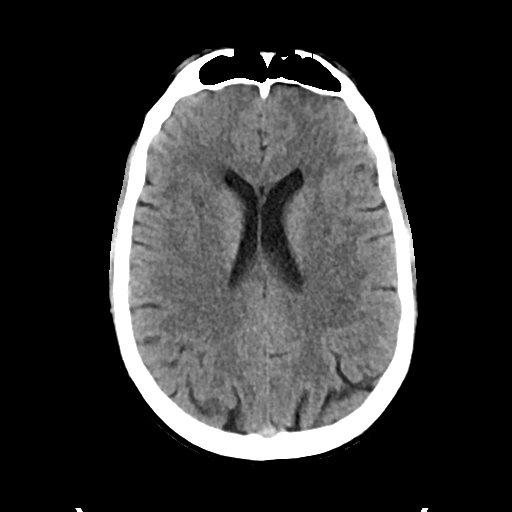
[im 22/36  bone]
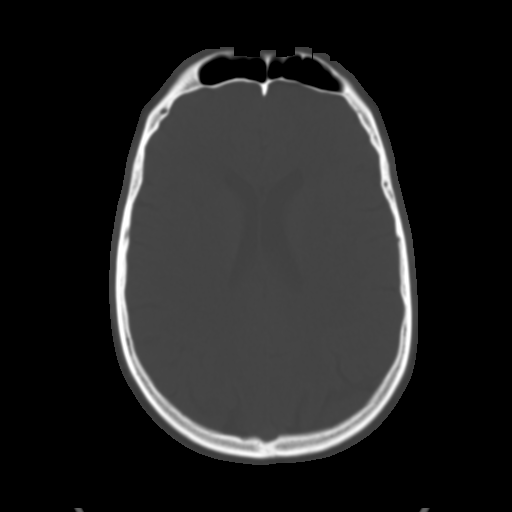
[im 27/36  brain]
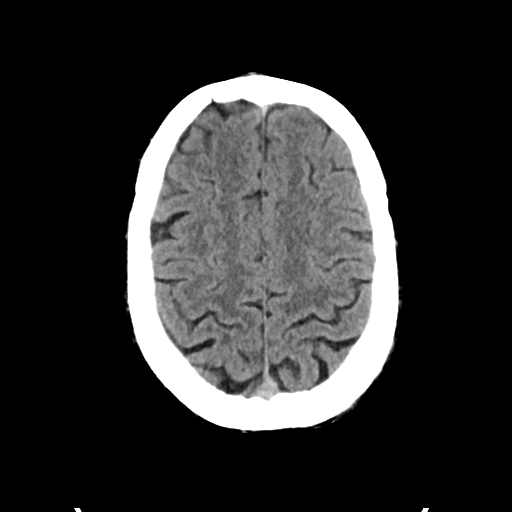
[im 31/36  brain]
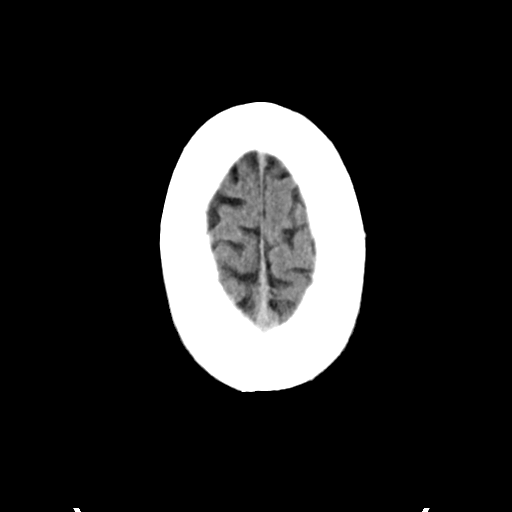

[Series 5: cor soft · coronal · 0.35mm/px · 3 of 75 slices shown]
[im 25/75  brain]
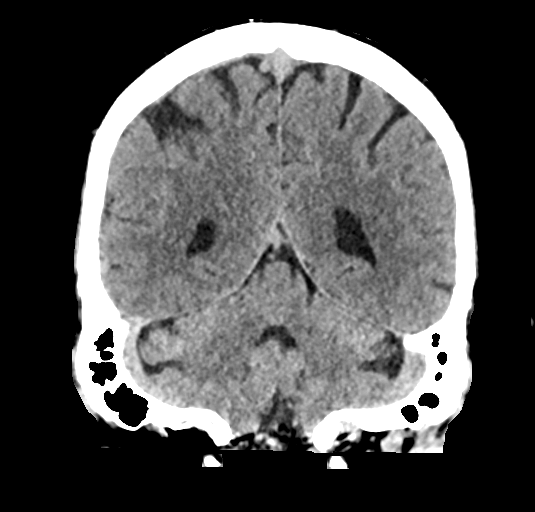
[im 33/75  brain]
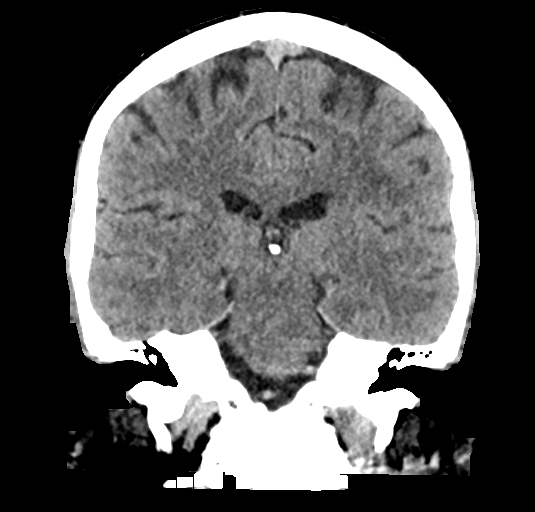
[im 42/75  brain]
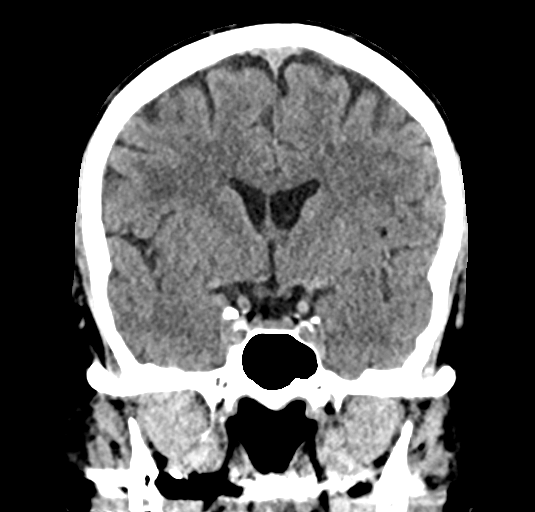

[Series 6: sag soft · sagittal · 0.35mm/px · 3 of 58 slices shown]
[im 20/58  brain]
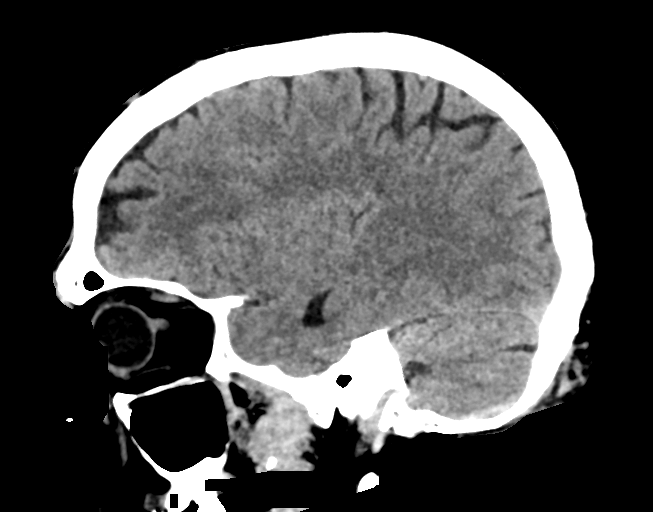
[im 29/58  brain]
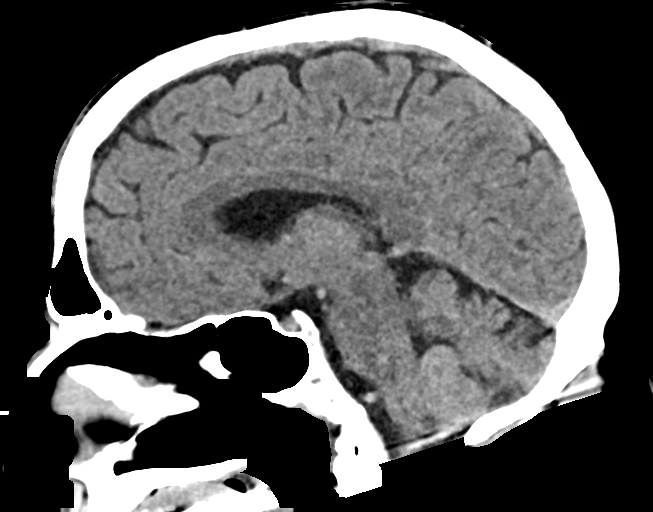
[im 39/58  brain]
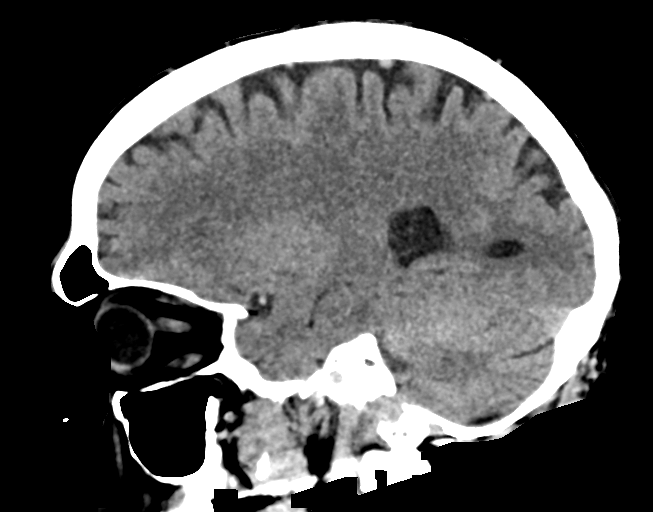

[17 of 47 positions shown; findings below may reference images not displayed]

FINDINGS: Brain: No evidence of acute infarction, hemorrhage, hydrocephalus,
extra-axial collection or mass lesion/mass effect. Scattered
subcortical and periventricular white matter hypodensities, which is
nonspecific but likely sequela of chronic small vessel ischemic
disease.

Vascular: No hyperdense vessel.  Carotid calcifications.

Skull: Normal. Negative for fracture or focal lesion.

Sinuses/Orbits: No acute finding.

Other: None.
IMPRESSION: No acute intracranial abnormality.

## 2020-07-15 IMAGING — MR MR MRA HEAD W/O CM
1 series · 19 of 48 positions shown · non-contrast
Comparison: CT head [DATE]

CLINICAL DATA: Stroke follow-up.



[Series 4: (id) mt fs · axial · 1.4mm · 0.43mm/px · z∈[-47,+42]mm · 19 of 136 slices shown]
[im 1/136]
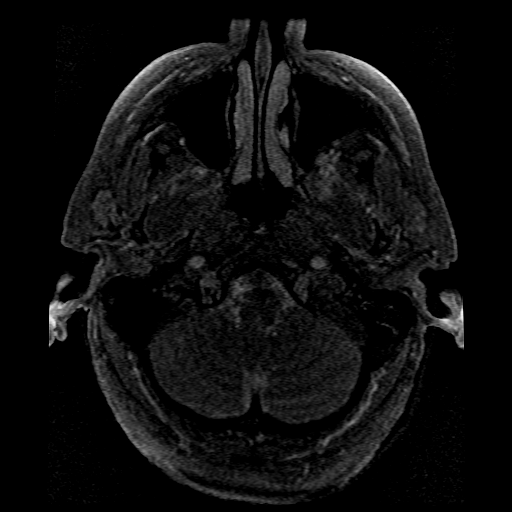
[im 3/136]
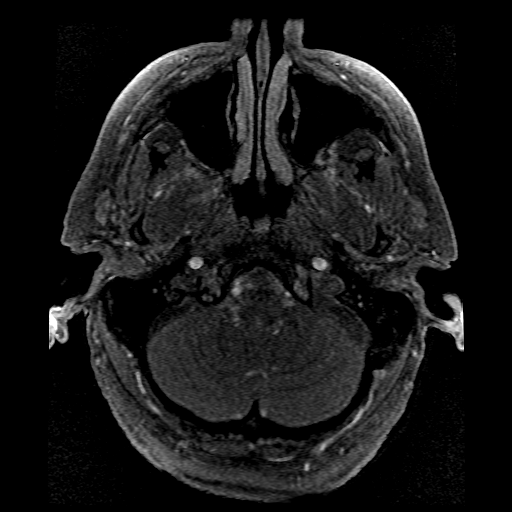
[im 6/136]
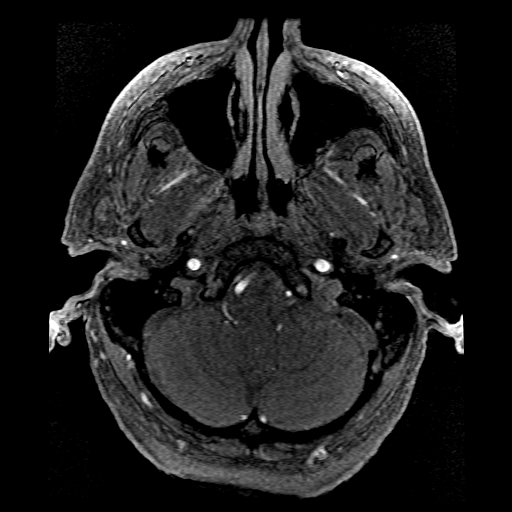
[im 9/136]
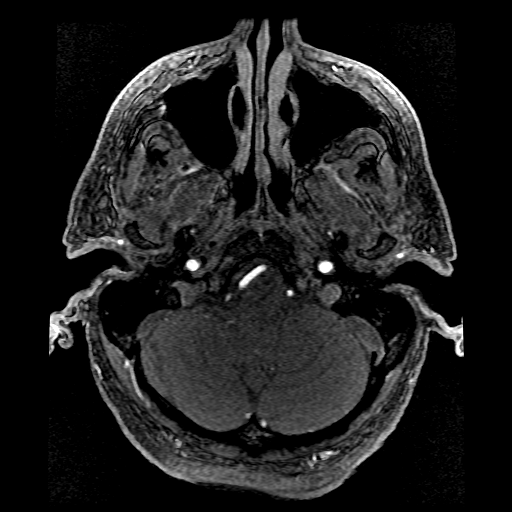
[im 12/136]
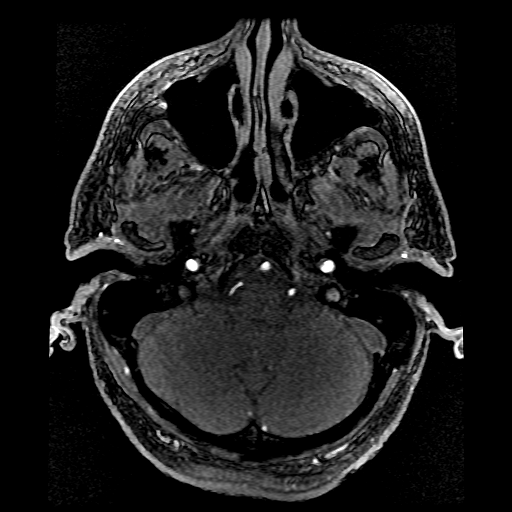
[im 15/136]
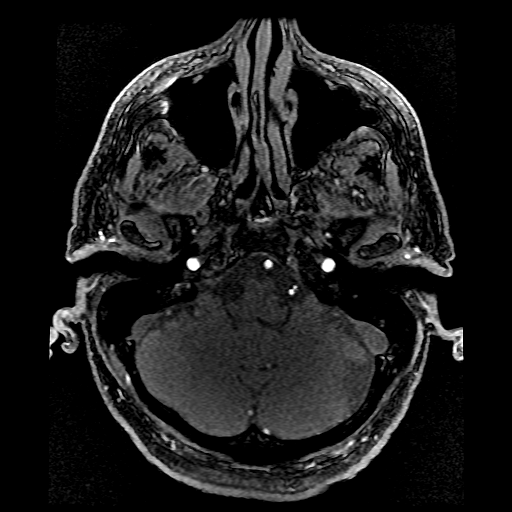
[im 18/136]
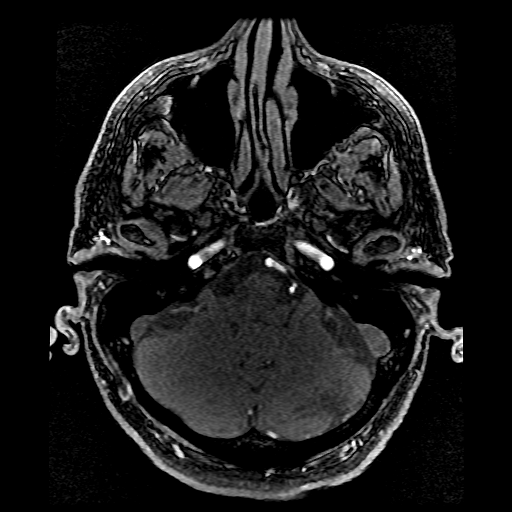
[im 21/136]
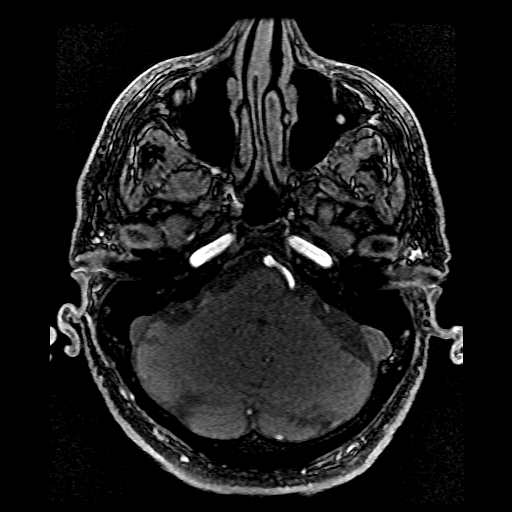
[im 23/136]
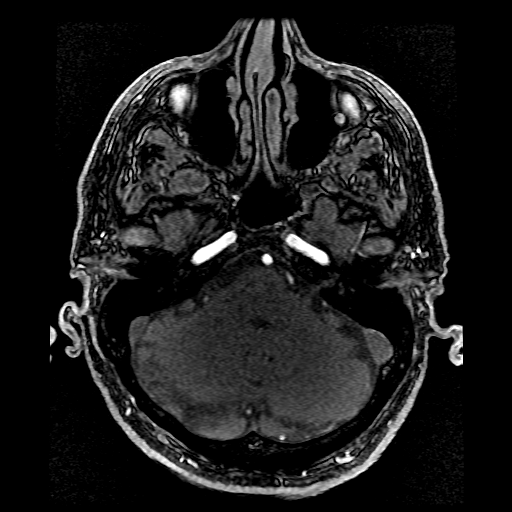
[im 26/136]
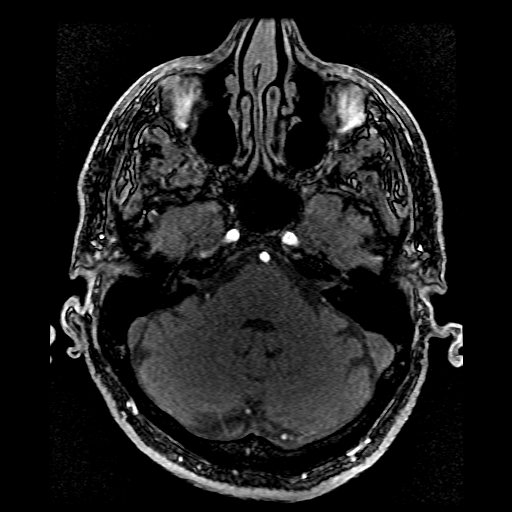
[im 29/136]
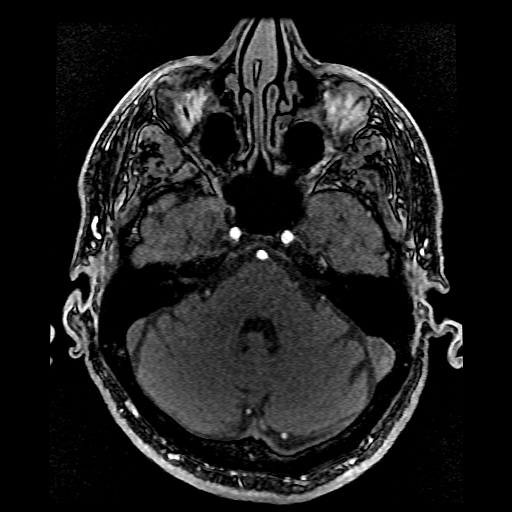
[im 44/136]
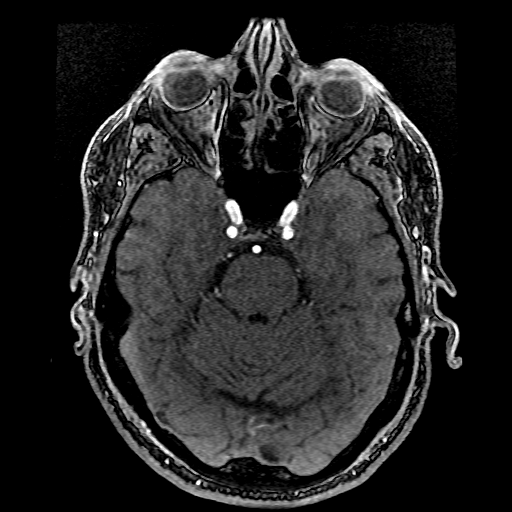
[im 61/136]
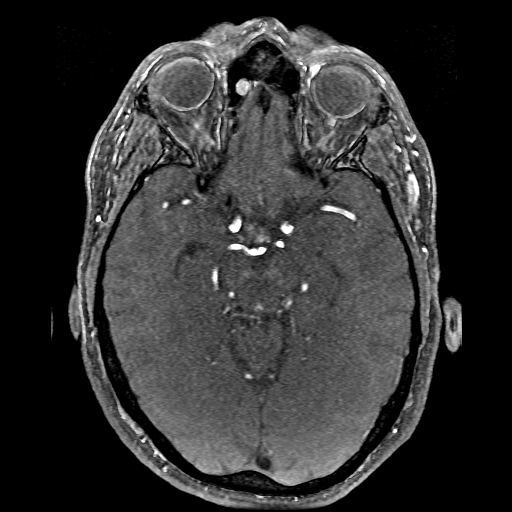
[im 69/136]
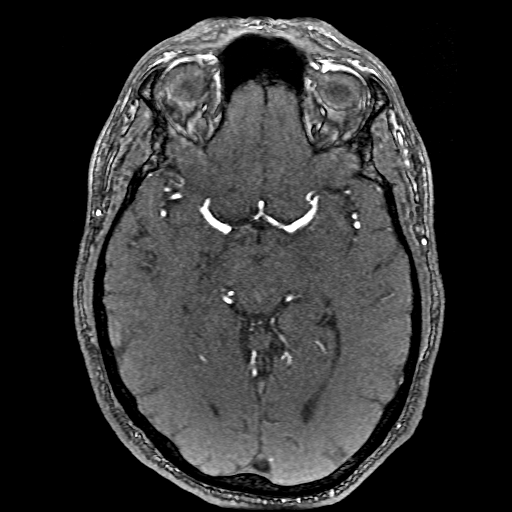
[im 78/136]
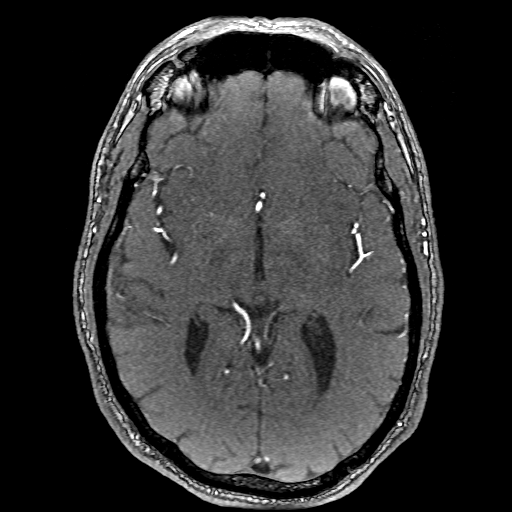
[im 95/136]
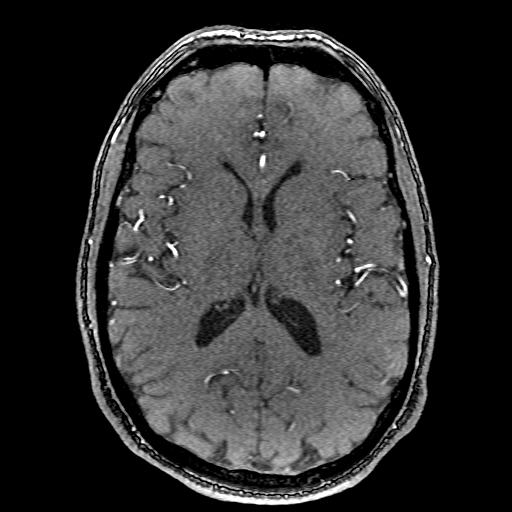
[im 113/136]
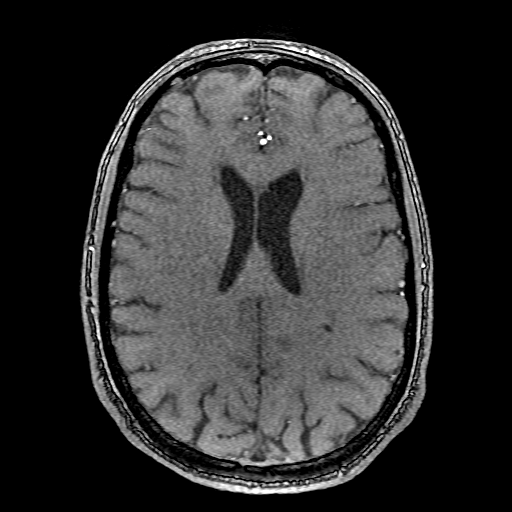
[im 115/136]
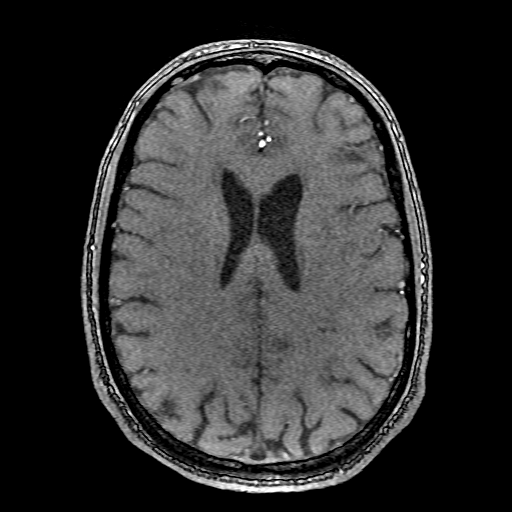
[im 130/136]
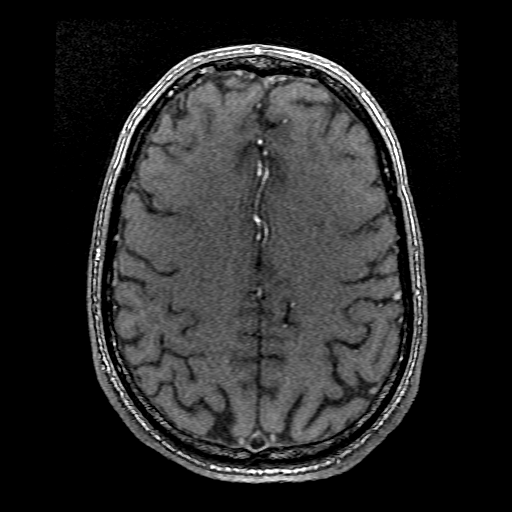

[19 of 48 positions shown; findings below may reference images not displayed]

FINDINGS: MRI HEAD FINDINGS

Brain: Negative for acute infarct.

Moderate white matter changes with scattered small deep white matter
hyperintensities bilaterally. Brainstem and cerebellum intact.
Negative for hemorrhage or mass. Ventricle size normal.

Vascular: Normal arterial flow voids

Skull and upper cervical spine: Negative

Sinuses/Orbits: Negative

Other: None

MRA HEAD FINDINGS

Both vertebral arteries patent to the basilar. PICA patent
bilaterally. Superior cerebellar and posterior cerebral arteries
patent without stenosis.

Internal carotid artery is widely patent bilaterally. Anterior and
middle cerebral arteries widely patent bilaterally. Negative for
stenosis.

Negative for cerebral aneurysm.

MRA NECK FINDINGS

Antegrade flow in the carotid and vertebral arteries bilaterally.

Carotid bifurcation widely patent bilaterally without stenosis. Both
vertebral arteries widely patent without stenosis.
IMPRESSION: 1. Negative for acute infarct. Moderate white matter changes
consistent with chronic microvascular ischemia
2. Negative MRA head
3. Negative MRA neck

## 2020-07-15 IMAGING — MR MR HEAD W/O CM
9 of 11 series · 32 of 48 positions shown · non-contrast
Comparison: CT head [DATE]

CLINICAL DATA: Stroke follow-up.



[Series 3: DWI · axial · 3.0mm · 1.09mm/px · z∈[-84,+69]mm · 8 of 104 slices shown (1 of 4)]
[im 1/104]
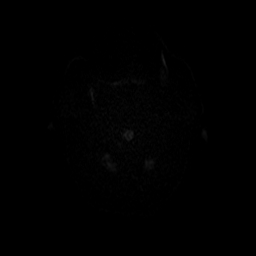
[im 15/104]
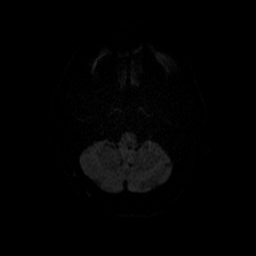
[im 30/104]
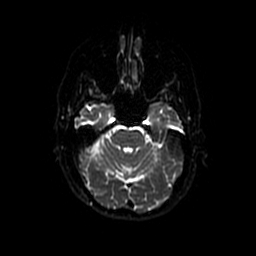
[im 45/104]
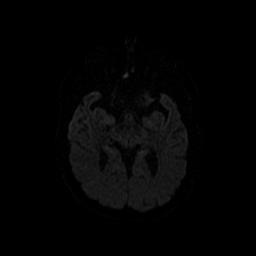
[im 59/104]
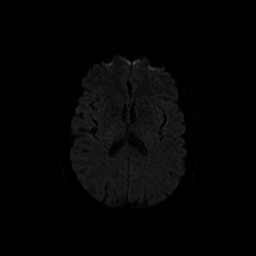
[im 74/104]
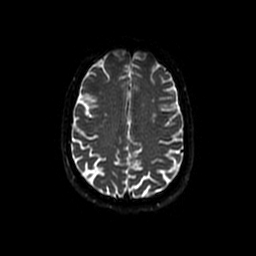
[im 89/104]
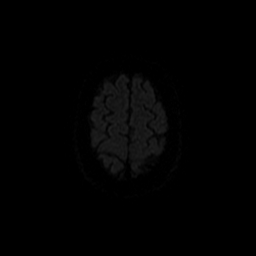
[im 104/104]
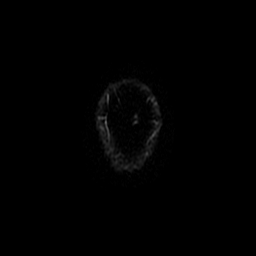

[Series 5: DWI · coronal · 5.0mm · 1.09mm/px · 5 of 72 slices shown (2 of 4)]
[im 1/72]
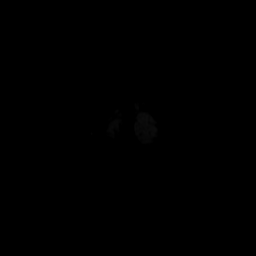
[im 18/72]
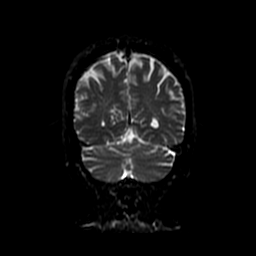
[im 36/72]
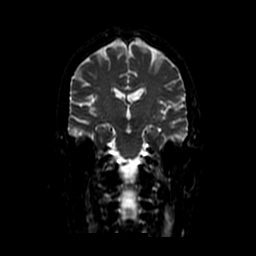
[im 54/72]
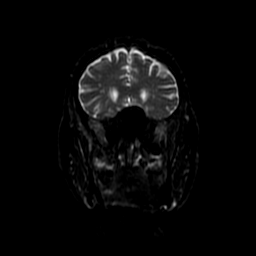
[im 72/72]
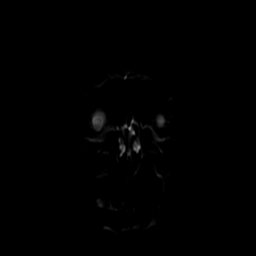

[Series 6: T1 · sagittal · 5.0mm · 0.49mm/px · 2 of 23 slices shown (1 of 2)]
[im 1/23]
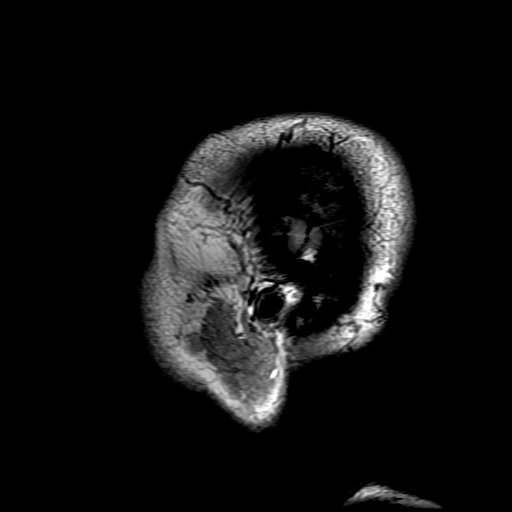
[im 23/23]
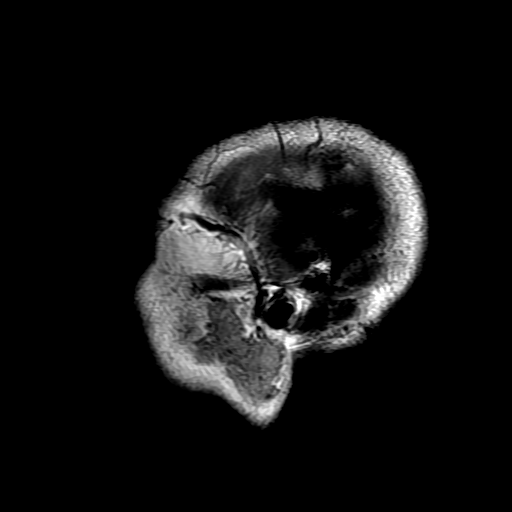

[Series 7: T2 · axial · 5.0mm · 0.43mm/px · z∈[-71,+79]mm · 2 of 26 slices shown]
[im 1/26]
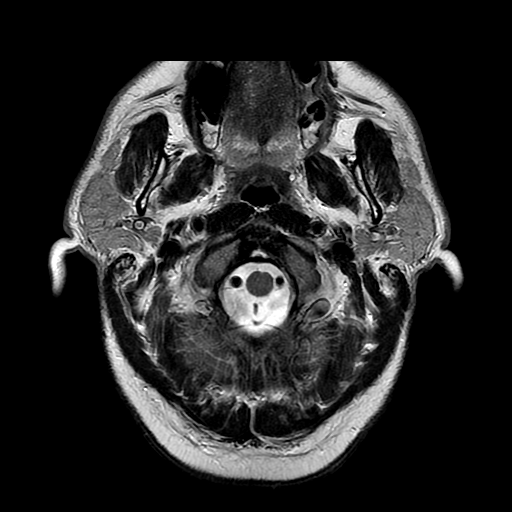
[im 26/26]
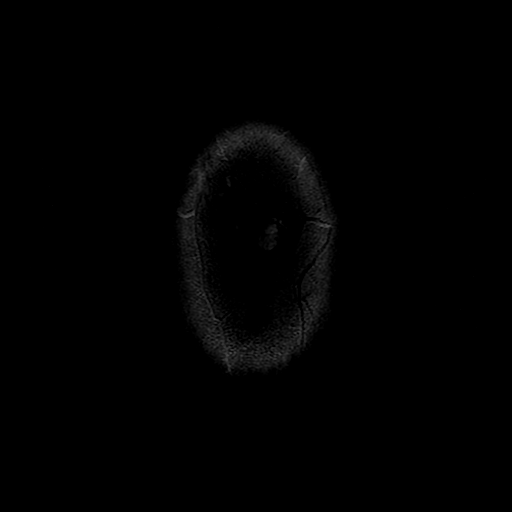

[Series 8: FLAIR · axial · 3.0mm · 0.43mm/px · z∈[-82,+73]mm · 2 of 27 slices shown]
[im 1/27]
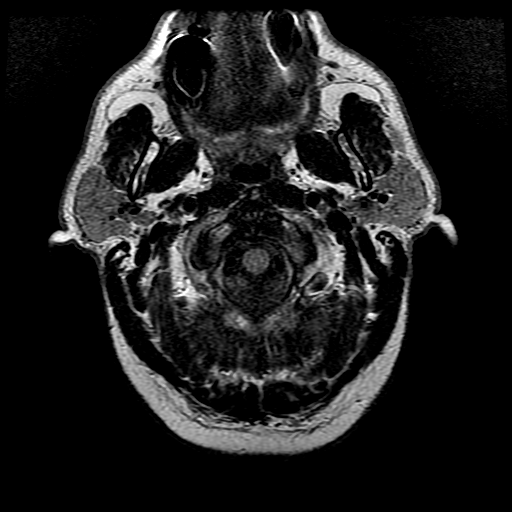
[im 27/27]
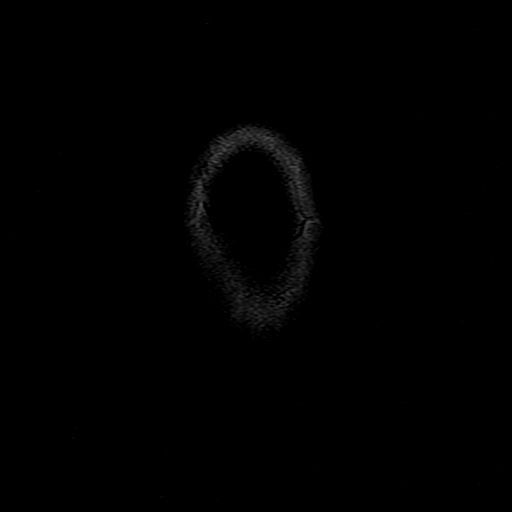

[Series 10: T1 · axial · 3.0mm · 0.47mm/px · z∈[-70,-7]mm · 4 of 100 slices shown (2 of 2)]
[im 1/100]
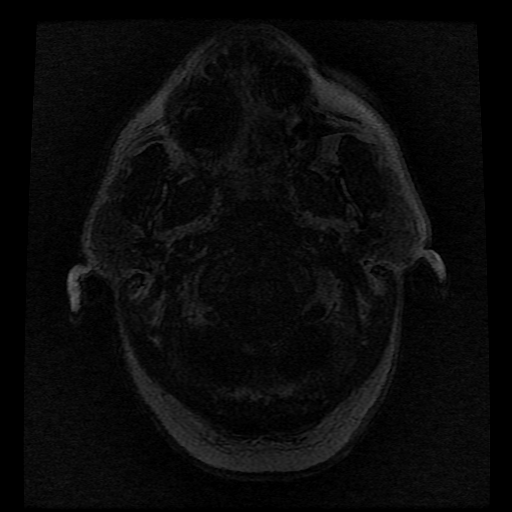
[im 15/100]
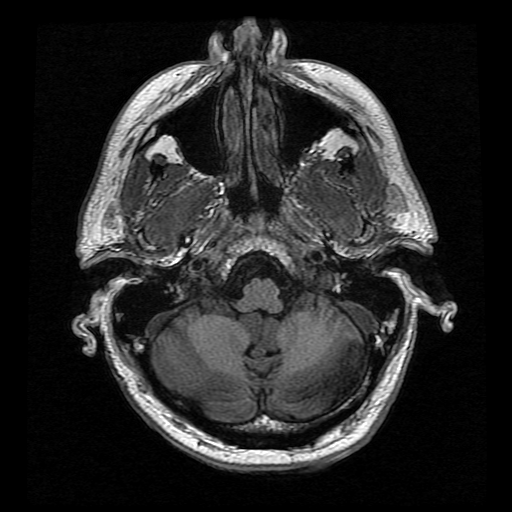
[im 29/100]
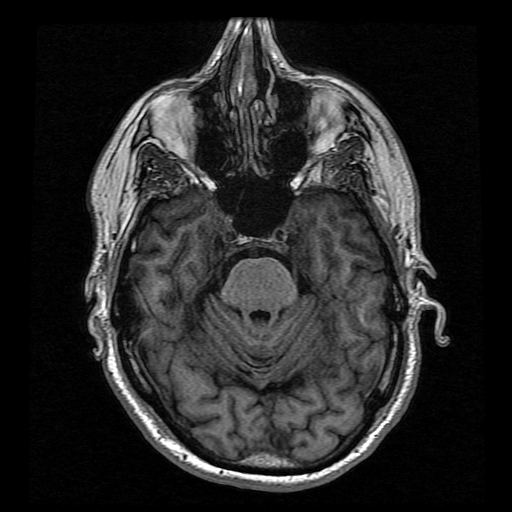
[im 43/100]
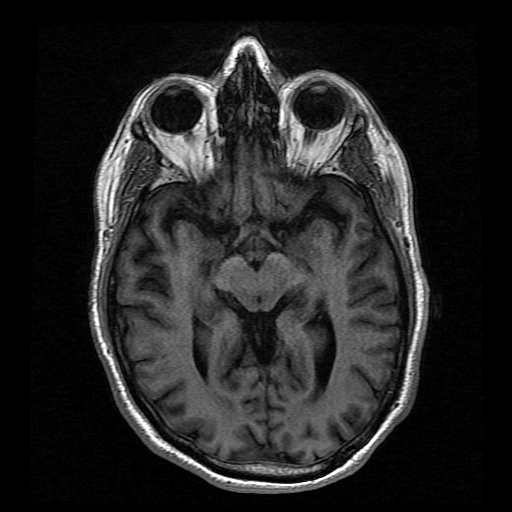

[Series 11: T2 post-contrast · coronal · 5.0mm · 0.39mm/px · 2 of 29 slices shown]
[im 1/29]
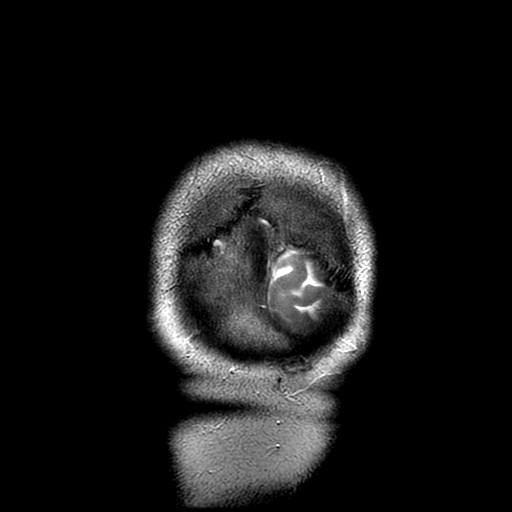
[im 29/29]
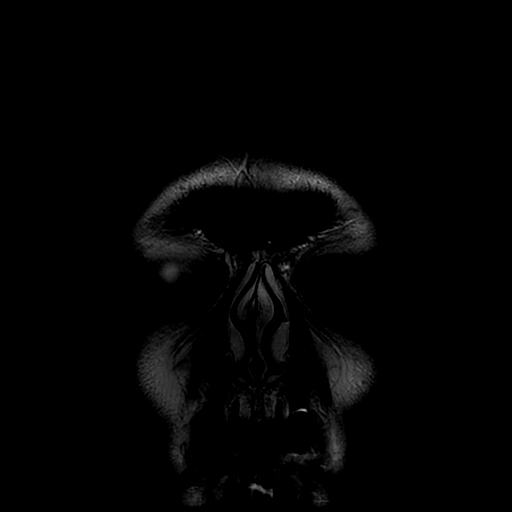

[Series 300: DWI · axial · 3.0mm · 1.09mm/px · z∈[-84,+69]mm · 4 of 52 slices shown (3 of 4)]
[im 1/52]
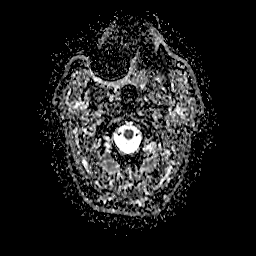
[im 18/52]
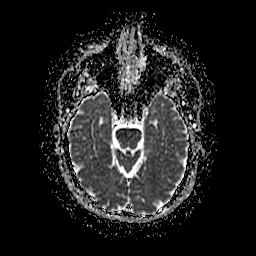
[im 35/52]
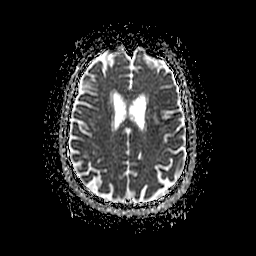
[im 52/52]
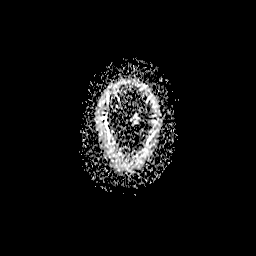

[Series 500: DWI · coronal · 5.0mm · 1.09mm/px · 3 of 36 slices shown (4 of 4)]
[im 1/36]
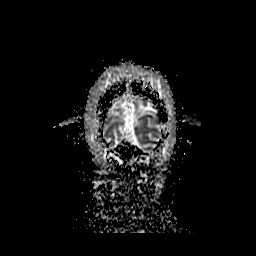
[im 18/36]
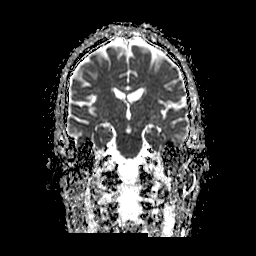
[im 36/36]
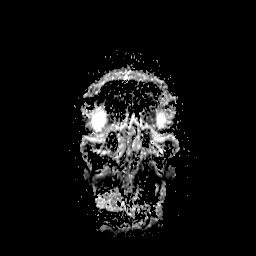

[32 of 48 positions shown; findings below may reference images not displayed]

FINDINGS: MRI HEAD FINDINGS

Brain: Negative for acute infarct.

Moderate white matter changes with scattered small deep white matter
hyperintensities bilaterally. Brainstem and cerebellum intact.
Negative for hemorrhage or mass. Ventricle size normal.

Vascular: Normal arterial flow voids

Skull and upper cervical spine: Negative

Sinuses/Orbits: Negative

Other: None

MRA HEAD FINDINGS

Both vertebral arteries patent to the basilar. PICA patent
bilaterally. Superior cerebellar and posterior cerebral arteries
patent without stenosis.

Internal carotid artery is widely patent bilaterally. Anterior and
middle cerebral arteries widely patent bilaterally. Negative for
stenosis.

Negative for cerebral aneurysm.

MRA NECK FINDINGS

Antegrade flow in the carotid and vertebral arteries bilaterally.

Carotid bifurcation widely patent bilaterally without stenosis. Both
vertebral arteries widely patent without stenosis.
IMPRESSION: 1. Negative for acute infarct. Moderate white matter changes
consistent with chronic microvascular ischemia
2. Negative MRA head
3. Negative MRA neck

## 2020-07-15 IMAGING — MR MR MRA NECK W/O CM
3 series · 19 of 48 positions shown · non-contrast
Comparison: CT head [DATE]

CLINICAL DATA: Stroke follow-up.



[Series 3: ax (id) · axial · 2.8mm · 0.47mm/px · z∈[-135,+92]mm · 13 of 172 slices shown]
[im 1/172]
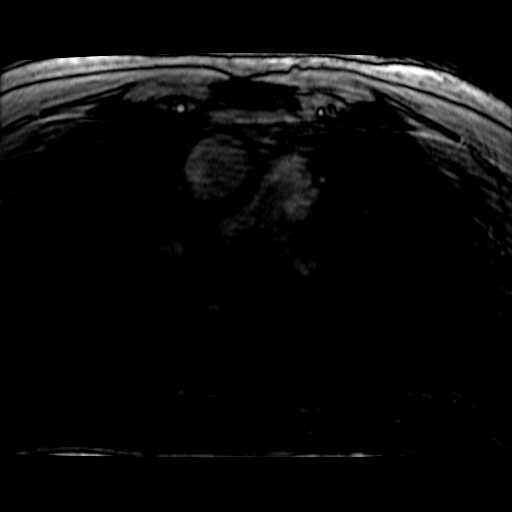
[im 5/172]
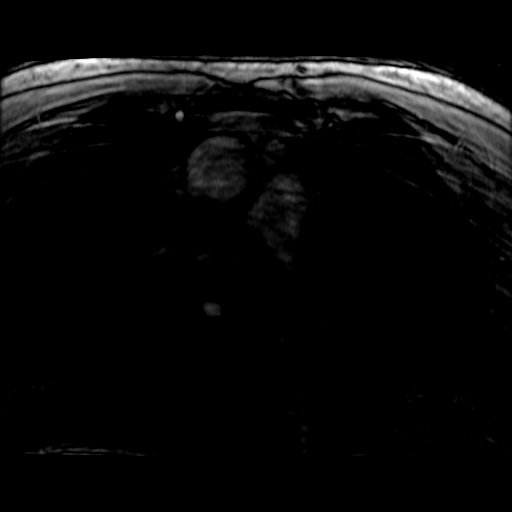
[im 9/172]
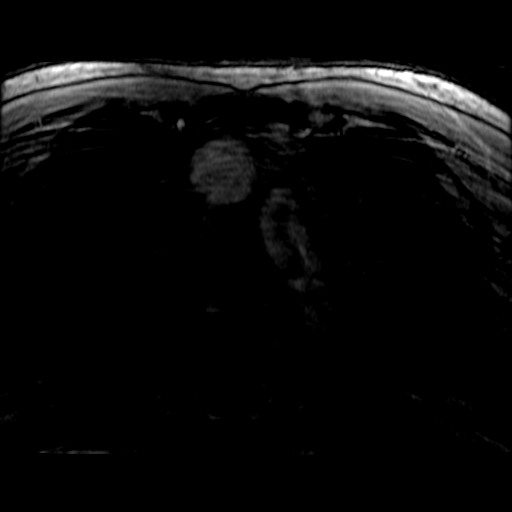
[im 13/172]
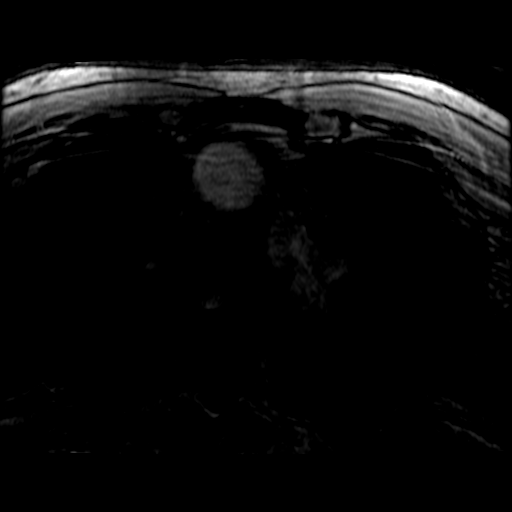
[im 30/172]
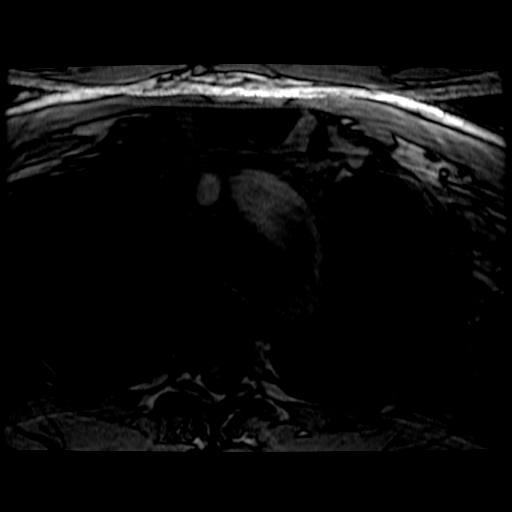
[im 55/172]
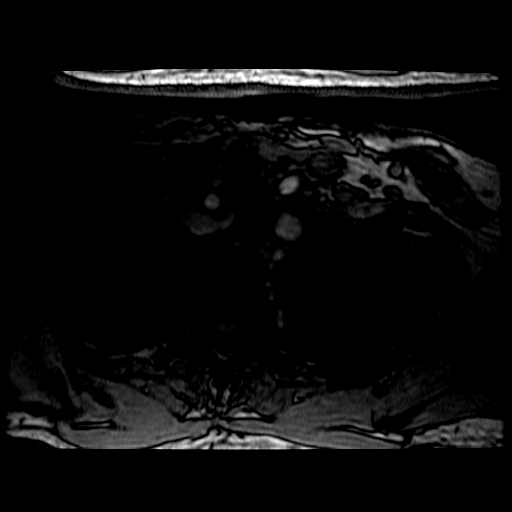
[im 76/172]
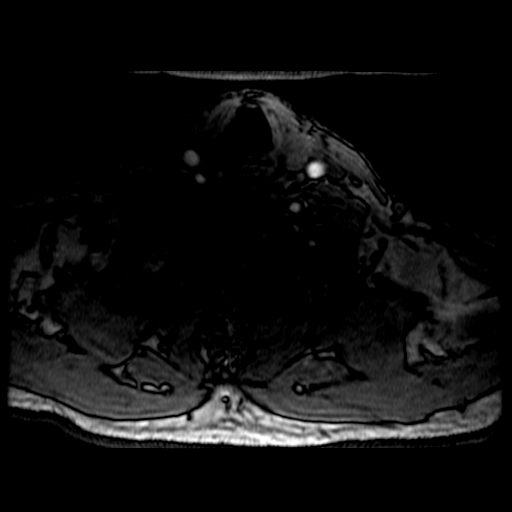
[im 88/172]
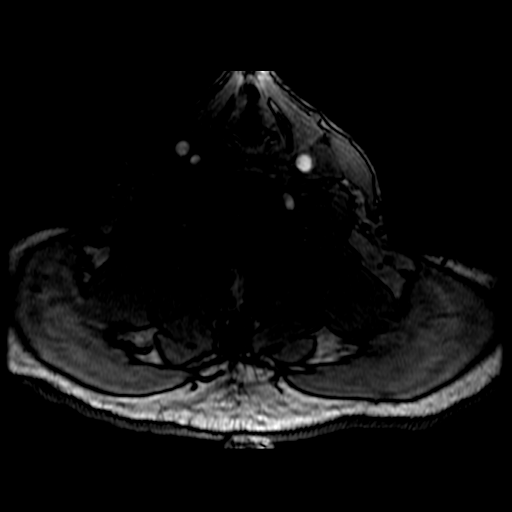
[im 96/172]
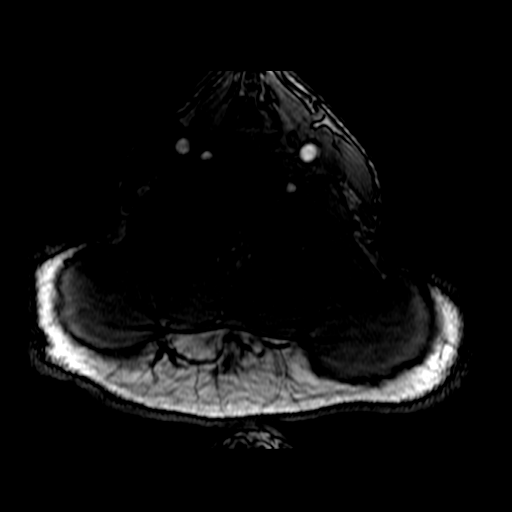
[im 117/172]
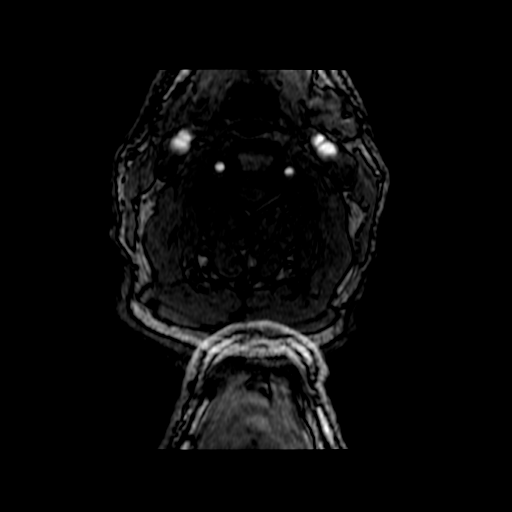
[im 142/172]
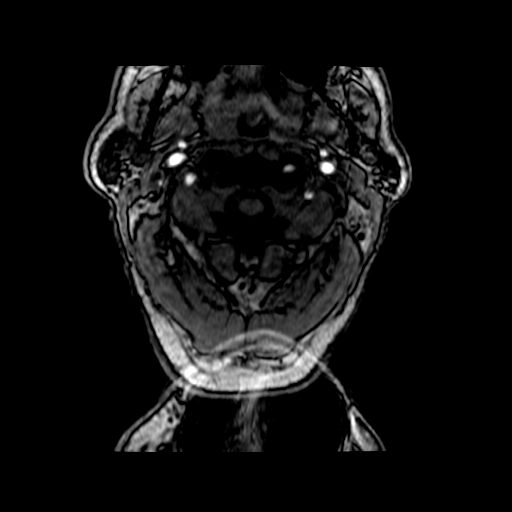
[im 146/172]
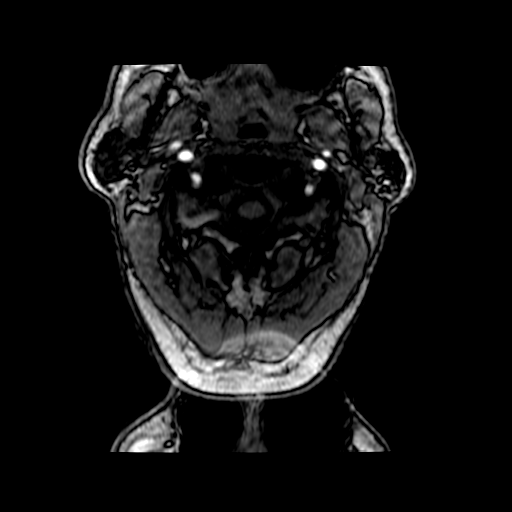
[im 163/172]
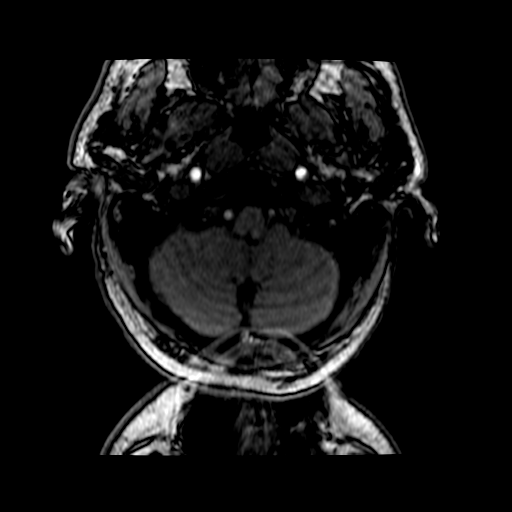

[Series 300: col:ax (id) · axial · 2.8mm · 0.47mm/px · 1 of 1 slices shown]
[im 1/1]
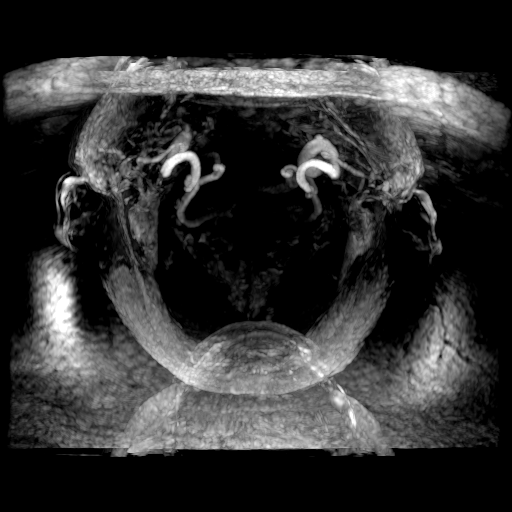

[Series 301: pjn:ax (id) · sagittal · 2.8mm · 0.47mm/px · 5 of 19 slices shown]
[im 1/19]
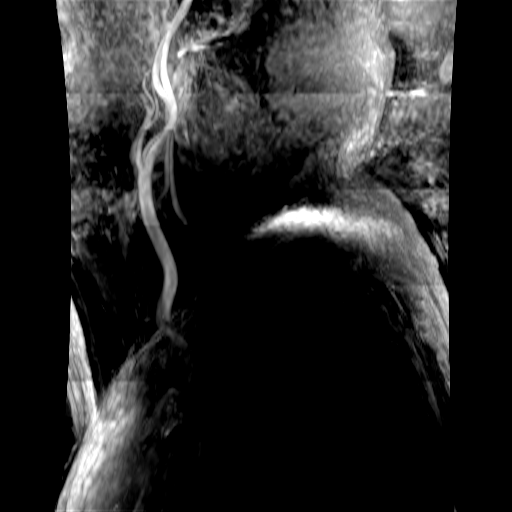
[im 5/19]
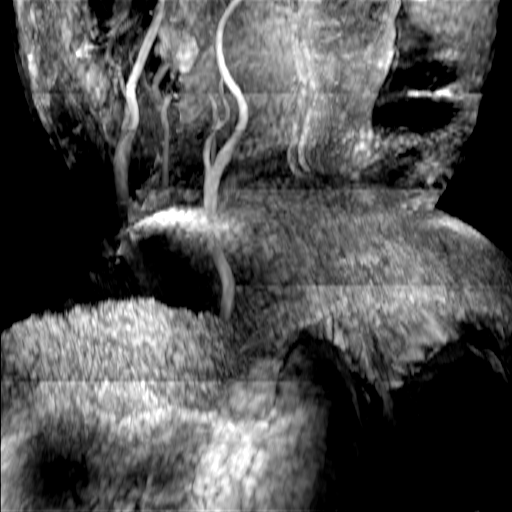
[im 10/19]
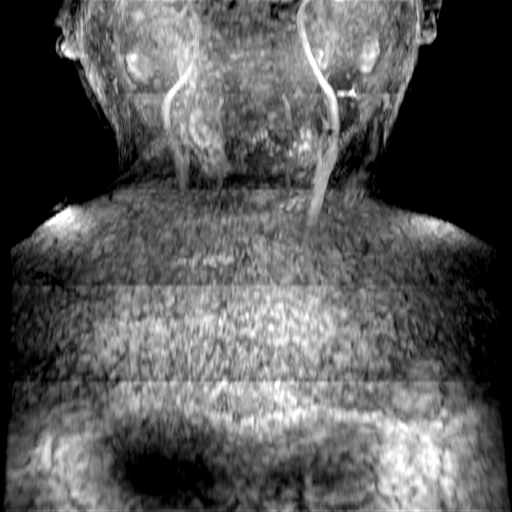
[im 14/19]
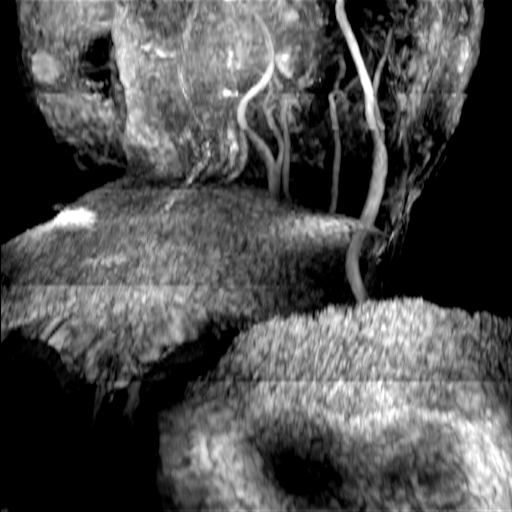
[im 19/19]
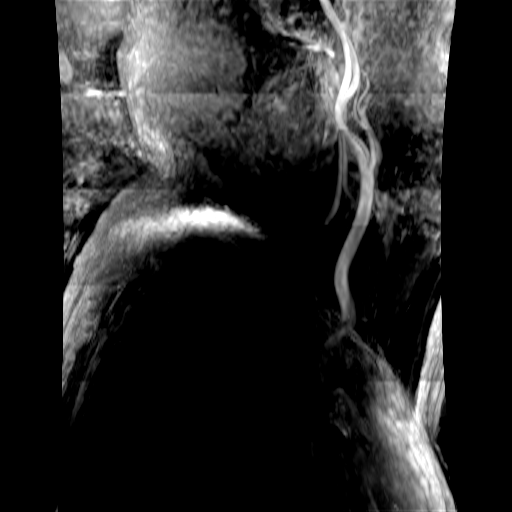

[19 of 48 positions shown; findings below may reference images not displayed]

FINDINGS: MRI HEAD FINDINGS

Brain: Negative for acute infarct.

Moderate white matter changes with scattered small deep white matter
hyperintensities bilaterally. Brainstem and cerebellum intact.
Negative for hemorrhage or mass. Ventricle size normal.

Vascular: Normal arterial flow voids

Skull and upper cervical spine: Negative

Sinuses/Orbits: Negative

Other: None

MRA HEAD FINDINGS

Both vertebral arteries patent to the basilar. PICA patent
bilaterally. Superior cerebellar and posterior cerebral arteries
patent without stenosis.

Internal carotid artery is widely patent bilaterally. Anterior and
middle cerebral arteries widely patent bilaterally. Negative for
stenosis.

Negative for cerebral aneurysm.

MRA NECK FINDINGS

Antegrade flow in the carotid and vertebral arteries bilaterally.

Carotid bifurcation widely patent bilaterally without stenosis. Both
vertebral arteries widely patent without stenosis.
IMPRESSION: 1. Negative for acute infarct. Moderate white matter changes
consistent with chronic microvascular ischemia
2. Negative MRA head
3. Negative MRA neck

## 2020-07-15 MED ORDER — SENNOSIDES-DOCUSATE SODIUM 8.6-50 MG PO TABS: 1.0000 | ORAL_TABLET | Freq: Every evening | ORAL | Status: DC | PRN

## 2020-07-15 MED ORDER — ATROPINE SULFATE 1 MG/10ML IJ SOSY: 0.5000 mg | PREFILLED_SYRINGE | INTRAMUSCULAR | Status: DC | PRN

## 2020-07-15 MED ORDER — ASPIRIN 81 MG PO CHEW
81.0000 mg | CHEWABLE_TABLET | Freq: Every day | ORAL | Status: DC
  Administered 2020-07-16: 81 mg via ORAL
  Filled 2020-07-15: qty 1

## 2020-07-15 MED ORDER — STROKE: EARLY STAGES OF RECOVERY BOOK
Freq: Once | Status: AC
  Filled 2020-07-15: qty 1

## 2020-07-15 MED ORDER — ENOXAPARIN SODIUM 40 MG/0.4ML IJ SOSY
40.0000 mg | PREFILLED_SYRINGE | INTRAMUSCULAR | Status: DC
  Administered 2020-07-15: 40 mg via SUBCUTANEOUS
  Filled 2020-07-15: qty 0.4

## 2020-07-15 MED ORDER — ASPIRIN 81 MG PO CHEW: 81.0000 mg | CHEWABLE_TABLET | Freq: Every day | ORAL | Status: DC

## 2020-07-15 MED ORDER — ACETAMINOPHEN 160 MG/5ML PO SOLN: 650.0000 mg | ORAL | Status: DC | PRN

## 2020-07-15 MED ORDER — ACETAMINOPHEN 650 MG RE SUPP: 650.0000 mg | RECTAL | Status: DC | PRN

## 2020-07-15 MED ORDER — ACETAMINOPHEN 325 MG PO TABS: 650.0000 mg | ORAL_TABLET | ORAL | Status: DC | PRN

## 2020-07-15 NOTE — ED Notes (Signed)
Pt HR trending in high 38-45 notified provider.

## 2020-07-15 NOTE — Telephone Encounter (Signed)
Pt called in c/o having sudden onset of tingling and numbness around his nose area yesterday while visiting a friend.  It was 13 hrs ago now per pt.   He also had numbness in 4 fingers of his right hand.   He lost his train of thought for a few moments too.   30 yrs ago he had a TIA and these symptoms are similar to what happened 30 yrs ago.   30 yrs ago the symptoms were more pronounced than now.    The symptoms lasted a few minutes. No symptoms this morning.  See triage notes.  I have referred him to the ED per the protocol.  I also went over with him any time he has stroke like symptoms with his history of having a TIA to immediately go to the ED.   He verbalized understanding.  He is agreeable to going to the ED now.   Prefers to go to [facility] so that's where he is going.    I forwarded my notes to [facility] for Dr. Alben Spittle information.    Reason for Disposition . [1] Weakness (i.e., paralysis, loss of muscle strength) of the face, arm / hand, or leg / foot on one side of the body AND [2] sudden onset AND [3] brief (now gone)    Had a TIA 30 yrs ago.  Answer Assessment - Initial Assessment Questions 1. SYMPTOM: "What is the main symptom you are concerned about?" (e.g., weakness, numbness)     I had a TIA 30 yrs ago.   This is similar.    I was visiting with someone and lost my train of thought.    My face around my nose area starting tingling and going numb.  My fingers on right hand went numb 4 of my fingers.     This lasted several minutes. 2. ONSET: "When did this start?" (minutes, hours, days; while sleeping)     It's been 13 hours since this happened. (7:50 AM now)   Yesterday's symptoms are on the same side of my body as before when I had a TIA. 3. LAST NORMAL: "When was the last time you were normal (no symptoms)?"     13 hrs ago.   Not having symptoms now. 4. PATTERN "Does this come and go, or has it been constant since it started?"  "Is it present  now?"     Not present now.   It's happening on the same side of my body as the TIA 30 yrs ago.   Those symptoms back then were more pronounced than these I had yesterday. 5. CARDIAC SYMPTOMS: "Have you had any of the following symptoms: chest pain, difficulty breathing, palpitations?"     No 6. NEUROLOGIC SYMPTOMS: "Have you had any of the following symptoms: headache, dizziness, vision loss, double vision, changes in speech, unsteady on your feet?"     Symptoms mentioned above 7. OTHER SYMPTOMS: "Do you have any other symptoms?"     No 8. PREGNANCY: "Is there any chance you are pregnant?" "When was your last menstrual period?"     N/A  Protocols used: NEUROLOGIC DEFICIT-A-AH

## 2020-07-15 NOTE — ED Triage Notes (Signed)
Pt reports possible TIA yesterday. Symptoms included difficulty word finding, numbness to R face and R fingers that lasted maximum of five minutes. Hx of TIA 35 years ago.

## 2020-07-15 NOTE — ED Notes (Signed)
CBG 86. 
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
## 2020-07-15 NOTE — ED Notes (Signed)
Contacted MRI to transport pt to [facility] room 18. Will tell wife to wait upstairs.
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
## 2020-07-15 NOTE — H&P (Signed)
History and Physical    Zachary Medina KGM:[id number] DOB: 1950-09-10 DOA: 07/15/2020  PCP: Jerrol Banana., MD (Confirm with patient/family/NH records and if not entered, this has to be entered at [facility] point of entry) Patient coming from: Home  I have personally briefly reviewed patient's old medical records in [place]  Chief Complaint: Feeling ok  HPI: Zachary Medina is a 70 y.o. male with medical history significant of bradycardia with frequent PVC, GERD, OSA on CPAP, presented with TIA-like symptoms.  Symptoms started yesterday afternoon, patient suddenly started to have blurry vision, " traffic lights look weird".  At that time denied any lightheadedness, no palpitations no chest pain and no numbness or weakness of antileukemic.  However, few hours later, patient started to experience temporary confusion, expressive aphasia, right-sided lip numbness and right-sided arm weakness for few minutes.  Symptoms lasted few minutes and went away.  Patient has been following with cardiologist at [facility] for history of bradycardia and frequent PVCs, baseline heart rate lower 50s.  Underwent a stress test 15 years ago which was normal. ED Course: Heart rate ranging from 36-49, blood pressure within normal limits, denied any symptoms of lightheadedness or blurry vision at this time.  CT head negative for acute findings.  K4.2.  Review of Systems: As per HPI otherwise 14 point review of systems negative.    Past Medical History:  Diagnosis Date  . GERD (gastroesophageal reflux disease)   . Hepatitis   . Hyperlipidemia   . Kidney stones   . TIA (transient ischemic attack)     Past Surgical History:  Procedure Laterality Date  . CHOLECYSTECTOMY    . COLONOSCOPY N/A 07/29/2014   Procedure: COLONOSCOPY;  Surgeon: Lucilla Lame, MD;  Location: [facility] ENDOSCOPY;  Service: Endoscopy;  Laterality: N/A;  . HERNIA REPAIR       reports that he has never smoked. He has never used smokeless  tobacco. He reports that he does not drink alcohol and does not use drugs.  No Known Allergies  Family History  Problem Relation Age of Onset  . Arthritis Mother   . Glaucoma Mother   . Congestive Heart Failure Father   . Parkinson's disease Father   . Prostate cancer Father   . Arthritis Father   . Heart disease Father   . Diabetes Brother      Prior to Admission medications   Medication Sig Start Date End Date Taking? Authorizing Provider  amoxicillin (AMOXIL) 875 MG tablet Take 1 tablet (875 mg total) by mouth 2 (two) times daily. 10/23/19   Jerrol Banana., MD  Ascorbic Acid (VITAMIN C PO) Take 1 tablet by mouth daily.    [provider]  aspirin 81 MG tablet Take 81 mg by mouth daily.     [provider]  Cyanocobalamin (VITAMIN B-12 PO) Take 1 tablet by mouth daily.    [provider]  magnesium oxide (MAG-OX) 400 MG tablet Take 1 tablet (400 mg total) by mouth at bedtime. 06/12/17   Jerrol Banana., MD  MAGNESIUM PO Take 1 tablet by mouth daily.    [provider]  meloxicam (MOBIC) 15 MG tablet Take 1 tablet (15 mg total) by mouth daily. 04/07/20   Jerrol Banana., MD  nitroGLYCERIN (NITROSTAT) 0.4 MG SL tablet Place 1 tablet (0.4 mg total) under the tongue every 5 (five) minutes as needed for up to 25 doses for chest pain. 04/07/20   Jerrol Banana., MD  Omega 3 1000 MG CAPS Take by mouth daily.     [provider]  Vitamin D, Cholecalciferol, 1000 units TABS Take 2,000 Units by mouth daily.     [provider]    Physical Exam: Vitals:   07/15/20 1245 07/15/20 1300 07/15/20 1315 07/15/20 1318  BP: (!) 143/72  (!) 156/73   Pulse: (!) 44 (!) 35 (!) 46   Resp: 18 13 16    Temp:      TempSrc:      SpO2: 99% 99% 100%   Weight:    76.7 kg  Height:    5\' 8"  (1.727 m)    Constitutional: NAD, calm, comfortable Vitals:   07/15/20 1245 07/15/20 1300 07/15/20 1315 07/15/20 1318  BP: (!) 143/72   (!) 156/73   Pulse: (!) 44 (!) 35 (!) 46   Resp: 18 13 16    Temp:      TempSrc:      SpO2: 99% 99% 100%   Weight:    76.7 kg  Height:    5\' 8"  (1.727 m)   Eyes: PERRL, lids and conjunctivae normal ENMT: Mucous membranes are moist. Posterior pharynx clear of any exudate or lesions.Normal dentition.  Neck: normal, supple, no masses, no thyromegaly Respiratory: clear to auscultation bilaterally, no wheezing, no crackles. Normal respiratory effort. No accessory muscle use.  Cardiovascular: Regular rate and rhythm, no murmurs / rubs / gallops. No extremity edema. 2+ pedal pulses. No carotid bruits.  Abdomen: no tenderness, no masses palpated. No hepatosplenomegaly. Bowel sounds positive.  Musculoskeletal: no clubbing / cyanosis. No joint deformity upper and lower extremities. Good ROM, no contractures. Normal muscle tone.  Skin: no rashes, lesions, ulcers. No induration Neurologic: CN 2-12 grossly intact. Sensation intact, DTR normal. Strength 5/5 in all 4.  Psychiatric: Normal judgment and insight. Alert and oriented x 3. Normal mood.     Labs on Admission: I have personally reviewed following labs and imaging studies  CBC: Recent Labs  Lab 07/15/20 1052 07/15/20 1056  WBC 4.3  --   NEUTROABS 2.6  --   HGB 15.7 15.3  HCT 48.0 45.0  MCV 95.0  --   PLT 140*  --    Basic Metabolic Panel: Recent Labs  Lab 07/15/20 1052 07/15/20 1056  NA 138 141  K 4.2 4.2  CL 105 104  CO2 27  --   GLUCOSE 129* 127*  BUN 21 21  CREATININE 1.28* 1.30*  CALCIUM 9.0  --    GFR: Estimated Creatinine Clearance: 51.2 mL/min (A) (by C-G formula based on SCr of 1.3 mg/dL (H)). Liver Function Tests: Recent Labs  Lab 07/15/20 1052  AST 21  ALT 22  ALKPHOS 53  BILITOT 2.2*  PROT 6.4*  ALBUMIN 3.8   No results for input(s): LIPASE, AMYLASE in the last 168 hours. No results for input(s): AMMONIA in the last 168 hours. Coagulation Profile: Recent Labs  Lab 07/15/20 1052  INR 1.0    Cardiac Enzymes: No results for input(s): CKTOTAL, CKMB, CKMBINDEX, TROPONINI in the last 168 hours. BNP (last 3 results) No results for input(s): PROBNP in the last 8760 hours. HbA1C: No results for input(s): HGBA1C in the last 72 hours. CBG: Recent Labs  Lab 07/15/20 1328  GLUCAP 86   Lipid Profile: No results for input(s): CHOL, HDL, LDLCALC, TRIG, CHOLHDL, LDLDIRECT in the last 72 hours. Thyroid Function Tests: No results for input(s): TSH, T4TOTAL, FREET4, T3FREE, THYROIDAB in the last 72 hours. Anemia Panel: No results for  input(s): VITAMINB12, FOLATE, FERRITIN, TIBC, IRON, RETICCTPCT in the last 72 hours. Urine analysis:    Component Value Date/Time   BILIRUBINUR negative 11/21/2018 1050   PROTEINUR Negative 11/21/2018 1050   UROBILINOGEN 0.2 11/21/2018 1050   NITRITE Negative 11/21/2018 1050   LEUKOCYTESUR Negative 11/21/2018 1050    Radiological Exams on Admission: CT HEAD WO CONTRAST  Result Date: 07/15/2020 CLINICAL DATA:  Transient ischemic attack (TIA) EXAM: CT HEAD WITHOUT CONTRAST TECHNIQUE: Contiguous axial images were obtained from the base of the skull through the vertex without intravenous contrast. COMPARISON:  None. FINDINGS: Brain: No evidence of acute infarction, hemorrhage, hydrocephalus, extra-axial collection or mass lesion/mass effect. Scattered subcortical and periventricular white matter hypodensities, which is nonspecific but likely sequela of chronic small vessel ischemic disease. Vascular: No hyperdense vessel.  Carotid calcifications. Skull: Normal. Negative for fracture or focal lesion. Sinuses/Orbits: No acute finding. Other: None. IMPRESSION: No acute intracranial abnormality. Electronically Signed   By: Maurine Simmering   On: 07/15/2020 12:15    EKG: Independently reviewed.  Sinus bradycardia, no ST-T changes.  Assessment/Plan Active Problems:   TIA (transient ischemic attack)   Bradycardia  (please populate well all problems here in Problem  List. (For example, if patient is on BP meds at home and you resume or decide to hold them, it is a problem that needs to be her. Same for CAD, COPD, HLD and so on)  TIA -On home ASA -Continued TIA work-up MRI brain, MRA brain and neck. -Echocardiogram -Permissive hypertension  Near syncope -Suspect related to bradycardia. -Consult cardiology -As needed atropine for HR<35 -BP maintains, denied any symptoms of near syncope, no indication for PPM at this time. -TSH  OSA -CPAP HS  Hx of bradycardia -As above  DVT prophylaxis: Lovenox code Status: Full Code Family Communication: Wife at bedside Disposition Plan: Expect 1 to 2 days hospital stay, for close monitoring and bradycardia work-up and TIA work-up. Consults called: cardiology Admission status: Tele admit  Lequita Halt MD [facility] Pager [phone]  07/15/2020, 1:38 PM

## 2020-07-15 NOTE — ED Notes (Addendum)
Pt tolerated swallow test well.

## 2020-07-15 NOTE — Progress Notes (Signed)
Patient received to the unit. Patient is alert and oriented x4. Iv in place. Skin assessment done with another nurse. Given instruction about call bell and phone. Bed in low position and call bell in reach.

## 2020-07-15 NOTE — ED Notes (Signed)
Got patient undressed into a gown on the monitor patient is resting with family at bedside and call bell in reach

## 2020-07-15 NOTE — ED Notes (Signed)
Pt transported to MRI 

## 2020-07-16 ENCOUNTER — Encounter (HOSPITAL_COMMUNITY): Payer: Self-pay | Admitting: Internal Medicine

## 2020-07-16 ENCOUNTER — Inpatient Hospital Stay (HOSPITAL_BASED_OUTPATIENT_CLINIC_OR_DEPARTMENT_OTHER): Payer: Medicare HMO

## 2020-07-16 ENCOUNTER — Inpatient Hospital Stay (HOSPITAL_COMMUNITY): Payer: Medicare HMO

## 2020-07-16 DIAGNOSIS — G459 Transient cerebral ischemic attack, unspecified: Secondary | ICD-10-CM | POA: Diagnosis present

## 2020-07-16 DIAGNOSIS — R001 Bradycardia, unspecified: Secondary | ICD-10-CM | POA: Diagnosis not present

## 2020-07-16 DIAGNOSIS — R4701 Aphasia: Secondary | ICD-10-CM

## 2020-07-16 LAB — RAPID URINE DRUG SCREEN, HOSP PERFORMED
Amphetamines: NOT DETECTED
Barbiturates: NOT DETECTED
Benzodiazepines: NOT DETECTED
Cocaine: NOT DETECTED
Opiates: NOT DETECTED
Tetrahydrocannabinol: NOT DETECTED

## 2020-07-16 LAB — ECHOCARDIOGRAM COMPLETE
Area-P 1/2: 2.6 cm2
Height: 68 in
P 1/2 time: 1112 msec
S' Lateral: 2.8 cm
Weight: 2704 oz

## 2020-07-16 LAB — LIPID PANEL
Cholesterol: 189 mg/dL (ref 0–200)
HDL: 30 mg/dL — ABNORMAL LOW (ref >40)
LDL Cholesterol: 132 mg/dL — ABNORMAL HIGH (ref 0–99)
Total CHOL/HDL Ratio: 6.3 RATIO
Triglycerides: 136 mg/dL (ref <150)
VLDL: 27 mg/dL (ref 0–40)

## 2020-07-16 LAB — SARS CORONAVIRUS 2 (TAT 6-24 HRS): SARS Coronavirus 2: NEGATIVE

## 2020-07-16 LAB — HEMOGLOBIN A1C
Hgb A1c MFr Bld: 6 % — ABNORMAL HIGH (ref 4.8–5.6)
Mean Plasma Glucose: 125.5 mg/dL

## 2020-07-16 MED ORDER — CLOPIDOGREL BISULFATE 75 MG PO TABS
75.0000 mg | ORAL_TABLET | Freq: Every day | ORAL | Status: DC
  Administered 2020-07-16: 75 mg via ORAL
  Filled 2020-07-16: qty 1

## 2020-07-16 MED ORDER — ATORVASTATIN CALCIUM 80 MG PO TABS
80.0000 mg | ORAL_TABLET | Freq: Every day | ORAL | Status: DC
  Administered 2020-07-16: 80 mg via ORAL
  Filled 2020-07-16: qty 1

## 2020-07-16 MED ORDER — ATORVASTATIN CALCIUM 80 MG PO TABS: 80.0000 mg | ORAL_TABLET | Freq: Every day | ORAL | 0 refills | Status: DC

## 2020-07-16 MED ORDER — CLOPIDOGREL BISULFATE 75 MG PO TABS: 75.0000 mg | ORAL_TABLET | Freq: Every day | ORAL | 0 refills | Status: DC

## 2020-07-16 NOTE — Progress Notes (Signed)
SLP Cancellation Note  Patient Details Name: Zachary Medina MRN: [id number] DOB: 11-17-1950   Cancelled treatment:       Reason Eval/Treat Not Completed: SLP screened, no needs identified, will sign off   Jejuan Scala, Katherene Ponto 07/16/2020, 8:02 AM

## 2020-07-16 NOTE — Progress Notes (Signed)
  Echocardiogram 2D Echocardiogram has been performed.  Zachary Medina 07/16/2020, 1:55 PM

## 2020-07-16 NOTE — Progress Notes (Addendum)
  Reviewed telemetry.   Pt with nocturnal bradycardia in 40s but HRs 50-70s while awake and active around room.   No current indication for pacing with no clear correlation between bradycardia and symptoms.  If monitoring is desired for TIA, would consider external monitoring initially with bradycardia.   Discussed above with Dr. Curt Bears. We will plan 6-8 week outpatient follow up, and follow at a distance for disposition and neuro recommendations.   Legrand Como [address]" [place], PA-C  07/16/2020 10:46 AM

## 2020-07-16 NOTE — Progress Notes (Signed)
PT Cancellation Note  Patient Details Name: Zachary Medina MRN: [id number] DOB: October 29, 1950   Cancelled Treatment:    Reason Eval/Treat Not Completed: PT screened, no needs identified, will sign off - Per OT, pt with no deficits s/p resolution of TIA. Pt independent with mobility, PT to sign off. Please reconsult if needed.   Stacie Glaze, PT DPT Acute Rehabilitation Services Pager [phone]  Office [phone]    Louis Matte 07/16/2020, 8:42 AM

## 2020-07-16 NOTE — Care Management CC44 (Signed)
Condition Code 44 Documentation Completed  Patient Details  Name: Zachary Medina MRN: [id number] Date of Birth: 05-25-1950   Condition Code 44 given:  Yes Patient signature on Condition Code 44 notice:  Yes Documentation of 2 MD's agreement:  Yes Code 44 added to claim:  Yes Verbal permission to sign   Verdell Carmine, RN 07/16/2020, 4:32 PM

## 2020-07-16 NOTE — Progress Notes (Signed)
Await echo and EEG for d/c planning. JV

## 2020-07-16 NOTE — Discharge Instructions (Signed)
Aspirin 81mg  daily along with plavix 75mg  daily x 21 days, followed by aspirin 81mg  daily alone.

## 2020-07-16 NOTE — Care Management Obs Status (Signed)
[facility] NOTIFICATION   Patient Details  Name: Zachary Medina MRN: [id number] Date of Birth: 1950/07/22   Medicare Observation Status Notification Given:  Yes Verbal permission to sign   Verdell Carmine, RN 07/16/2020, 4:31 PM

## 2020-07-16 NOTE — Progress Notes (Signed)
Pt alert and oriented x4. Vitals WDL. No acute distress noted. Pt verbalized understanding discharge instructions. Spouse at bedside. Pt transported home by wife via POV.

## 2020-07-16 NOTE — Progress Notes (Signed)
Pt states he wears CPAP at home sometimes but dont want to wear tonight.

## 2020-07-16 NOTE — Consult Note (Signed)
NEUROLOGY CONSULTATION NOTE   Date of service: Jul 16, 2020 Patient Name: Zachary Medina MRN:  [id number] DOB:  Jun 07, 1950 Reason for consult: "TIA vs syncope" Requesting Provider: Geradine Girt, DO _ _ _   _ __   _ __ _ _  __ __   _ __   __ _  History of Present Illness  Andray Dinolfo is a 70 y.o. male with PMH significant for HLD, Kidney stones, GERD, Hepatitis, prior TIA, OSA on CPAP who presents with an episode of on Tuesday with aphasia where he was in a meeting and talking and could not find the word for the type of dog he has. Could picture it but could not come up with the right word.  He also felt that the R side of his mouth was numb and his fingers were numb. Lasted 5 mins at the most, self resolved. [place] PCP the next day and was told to come to the ED.  Reports episode diagnosed as a TIA about 30 years ago with R sided numbness and vision changes.  Workup in the hospital with EKG showing bradycardia with PVCs, no fever, no hypoglycemia, creatinine mildly elevated from his baseline. Total bilirubin elevated to 2.2(this is chronic however).  HIV negative, TSH normal.  CTH w/o contrast with no acute abnormality, MRI Brain, MRA head and neck with no acute stroke, no LVO or significant stenosis. Notable for moderate white matter changes. TTE is pending.  Neurology was consulted to assess for neurological causes of his symptoms.  No new medications, no headaches with these episodes. No hx of seizures. No prior ICH, meningitis, no family hx of strokes or seizures.    ROS   Constitutional Denies weight loss, fever and chills.   HEENT Denies changes in vision and hearing.   Respiratory Denies SOB and cough.   CV Denies palpitations and CP   GI Denies abdominal pain, nausea, vomiting and diarrhea.   GU Denies dysuria and urinary frequency.   MSK Denies myalgia and joint pain.   Skin Denies rash and pruritus.   Neurological Denies headache and syncope.   Psychiatric Denies  recent changes in mood. Denies anxiety and depression.    Past History   Past Medical History:  Diagnosis Date  . GERD (gastroesophageal reflux disease)   . Hepatitis   . Hyperlipidemia   . Kidney stones   . TIA (transient ischemic attack)    Past Surgical History:  Procedure Laterality Date  . CHOLECYSTECTOMY    . COLONOSCOPY N/A 07/29/2014   Procedure: COLONOSCOPY;  Surgeon: Lucilla Lame, MD;  Location: [facility] ENDOSCOPY;  Service: Endoscopy;  Laterality: N/A;  . HERNIA REPAIR     Family History  Problem Relation Age of Onset  . Arthritis Mother   . Glaucoma Mother   . Congestive Heart Failure Father   . Parkinson's disease Father   . Prostate cancer Father   . Arthritis Father   . Heart disease Father   . Diabetes Brother    Social History   Socioeconomic History  . Marital status: Married    Spouse name: Not on file  . Number of children: 3  . Years of education: Not on file  . Highest education level: Master's degree (e.g., MA, MS, MEng, MEd, MSW, MBA)  Occupational History  . Not on file  Tobacco Use  . Smoking status: Never Smoker  . Smokeless tobacco: Never Used  Vaping Use  . Vaping Use: Never used  Substance and Sexual  Activity  . Alcohol use: No  . Drug use: No  . Sexual activity: Not on file  Other Topics Concern  . Not on file  Social History Narrative  . Not on file   Social Determinants of Health   Financial Resource Strain: Low Risk   . Difficulty of Paying Living Expenses: Not hard at all  Food Insecurity: No Food Insecurity  . Worried About Charity fundraiser in the Last Year: Never true  . Ran Out of Food in the Last Year: Never true  Transportation Needs: No Transportation Needs  . Lack of Transportation (Medical): No  . Lack of Transportation (Non-Medical): No  Physical Activity: Insufficiently Active  . Days of Exercise per Week: 4 days  . Minutes of Exercise per Session: 30 min  Stress: No Stress Concern Present  . Feeling of  Stress : Only a little  Social Connections: Socially Integrated  . Frequency of Communication with Friends and Family: More than three times a week  . Frequency of Social Gatherings with Friends and Family: More than three times a week  . Attends Religious Services: More than 4 times per year  . Active Member of Clubs or Organizations: Yes  . Attends Archivist Meetings: More than 4 times per year  . Marital Status: Married   No Known Allergies  Medications   Medications Prior to Admission  Medication Sig Dispense Refill Last Dose  . amoxicillin (AMOXIL) 875 MG tablet Take 1 tablet (875 mg total) by mouth 2 (two) times daily. 20 tablet 0   . Ascorbic Acid (VITAMIN C PO) Take 1 tablet by mouth daily.     Marland Kitchen aspirin 81 MG tablet Take 81 mg by mouth daily.      . Cyanocobalamin (VITAMIN B-12 PO) Take 1 tablet by mouth daily.     . magnesium oxide (MAG-OX) 400 MG tablet Take 1 tablet (400 mg total) by mouth at bedtime. 30 tablet 11   . MAGNESIUM PO Take 1 tablet by mouth daily.     . meloxicam (MOBIC) 15 MG tablet Take 1 tablet (15 mg total) by mouth daily. 30 tablet 3   . nitroGLYCERIN (NITROSTAT) 0.4 MG SL tablet Place 1 tablet (0.4 mg total) under the tongue every 5 (five) minutes as needed for up to 25 doses for chest pain. 25 tablet 4   . Omega 3 1000 MG CAPS Take by mouth daily.      . Vitamin D, Cholecalciferol, 1000 units TABS Take 2,000 Units by mouth daily.         Vitals   Vitals:   07/15/20 1647 07/15/20 2005 07/16/20 0025 07/16/20 0453  BP: (!) 158/79 (!) 141/86 130/73 127/71  Pulse: (!) 48 (!) 51 (!) 53 74  Resp: 18 18 18 18   Temp: 97.6 F (36.4 C) 98.1 F (36.7 C) 97.8 F (36.6 C) 97.8 F (36.6 C)  TempSrc: Oral Oral Oral Oral  SpO2: 97% 97% 97% 96%  Weight:      Height:         Body mass index is 25.7 kg/m.  Physical Exam   General: Laying comfortably in bed; in no acute distress.  HENT: Normal oropharynx and mucosa. Normal external appearance  of ears and nose.  Neck: Supple, no pain or tenderness  CV: No JVD. No peripheral edema.  Pulmonary: Symmetric Chest rise. Normal respiratory effort.  Abdomen: Soft to touch, non-tender.  Ext: No cyanosis, edema, or deformity  Skin: No rash. Normal  palpation of skin.   Musculoskeletal: Normal digits and nails by inspection. No clubbing.   Neurologic Examination  Mental status/Cognition: Alert, oriented to self, place, month and year, good attention.  Speech/language: Fluent, comprehension intact, object naming intact, repetition intact.  Cranial nerves:   CN II Pupils equal and reactive to light, no VF deficits    CN III,IV,VI EOM intact, no gaze preference or deviation, no nystagmus    CN V normal sensation in V1, V2, and V3 segments bilaterally    CN VII no asymmetry, no nasolabial fold flattening    CN VIII normal hearing to speech   CN IX & X normal palatal elevation, no uvular deviation   CN XI 5/5 head turn and 5/5 shoulder shrug bilaterally    CN XII midline tongue protrusion    Motor:  Muscle bulk: normal, tone normal, pronator drift none tremor none Mvmt Root Nerve  Muscle Right Left Comments  SA C5/6 Ax Deltoid 5 5   EF C5/6 Mc Biceps 5 5   EE C6/7/8 Rad Triceps 5 5   WF C6/7 Med FCR     WE C7/8 PIN ECU     F Ab C8/T1 U ADM/FDI 5 5   HF L1/2/3 Fem Illopsoas 5 5   KE L2/3/4 Fem Quad 5 5   DF L4/5 D Peron Tib Ant 5 5   PF S1/2 Tibial Grc/Sol 5 5    Reflexes:  Right Left Comments  Pectoralis      Biceps (C5/6) 2 2   Brachioradialis (C5/6) 2 2    Triceps (C6/7) 2 2    Patellar (L3/4) 2 2    Achilles (S1)      Hoffman      Plantar     Jaw jerk    Sensation:  Light touch Intact in face and all extremities.   Pin prick    Temperature    Vibration   Proprioception    Coordination/Complex Motor:  - Finger to Nose intact BL - Heel to shin intact BL - Rapid alternating movement are normal - Gait: Deferred.  Labs   CBC:  Recent Labs  Lab 07/15/20 1052  07/15/20 1056  WBC 4.3  --   NEUTROABS 2.6  --   HGB 15.7 15.3  HCT 48.0 45.0  MCV 95.0  --   PLT 140*  --     Basic Metabolic Panel:  Lab Results  Component Value Date   NA 141 07/15/2020   K 4.2 07/15/2020   CO2 27 07/15/2020   GLUCOSE 127 (H) 07/15/2020   BUN 21 07/15/2020   CREATININE 1.30 (H) 07/15/2020   CALCIUM 9.0 07/15/2020   GFRNONAA >60 07/15/2020   GFRAA 75 12/09/2019   Lipid Panel:  Lab Results  Component Value Date   LDLCALC 132 (H) 07/16/2020   HgbA1c:  Lab Results  Component Value Date   HGBA1C 6.0 (H) 07/16/2020   Urine Drug Screen: No results found for: LABOPIA, COCAINSCRNUR, LABBENZ, AMPHETMU, THCU, LABBARB  Alcohol Level No results found for: [place]  CT Head without contrast: CTH was negative for a large hypodensity concerning for a large territory infarct or hyperdensity concerning for an ICH  MR Angio head and neck: No LVO, no significant stenosis.  MRI Brain: Negative for an acute stroke  rEEG:  Pending.  Impression   Dominico Rod is a 70 y.o. male with PMH significant for HLD, Kidney stones, GERD, Hepatitis, prior TIA, OSA on CPAP who presents with an episode of  aphasia + R face and hand numbness x 5 mins then self resolved. His neurologic examination is normal with no focal deficit and NIHSS of 0.  MRI Brain negative for a stroke. Symptoms could potentially localize to left hemisphere. TIAs, however, usually last much longer. Primary suspicion is still a TIS but will get a rEEG and orthostatic vitals to rule out seizures or orthostatic hypotension.   Recommendations  Plan:  - Frequent Neuro checks per stroke unit protocol - TTE is pending. - LDL elevated to 132, I ordered Atorvastatin 80mg  daily - HbA1c of 6.0 - Antithrombotic - Aspirin 81mg  daily along with plavix 75mg  daily x 21 days, followed by aspirin 81mg  daily alone. - Recommend DVT ppx - SBP goal - permissive hypertension first 24 h < 220/110. Held home meds.  - Continue  Telemetry monitoring for arrythmia - Stroke education booklet - PT/OT/SLP consult - I ordered urine drug screen, orthostatic vitals and a routine EEG. ______________________________________________________________________   Thank you for the opportunity to take part in the care of this patient. If you have any further questions, please contact the neurology consultation attending.  Signed,  [place] Pager Number [phone] _ _ _   _ __   _ __ _ _  __ __   _ __   __ _

## 2020-07-16 NOTE — Procedures (Signed)
Patient Name: Zachary Medina  MRN: [id number]  Epilepsy Attending: Lora Havens  Referring Physician/Provider: Dr. Donnetta Simpers Date: 07/16/2020 Duration: 22.50 mins  Patient history: 70 year old male with transient episode of aphasia, right face and hand numbness.  EEG to evaluate for seizures.  Level of alertness: Awake,asleep  AEDs during EEG study: None  Technical aspects: This EEG study was done with scalp electrodes positioned according to the 10-20 International system of electrode placement. Electrical activity was acquired at a sampling rate of 500Hz  and reviewed with a high frequency filter of 70Hz  and a low frequency filter of 1Hz . EEG data were recorded continuously and digitally stored.   Description: The posterior dominant rhythm consists of 9-10 Hz activity of moderate voltage (25-35 uV) seen predominantly in posterior head regions, symmetric and reactive to eye opening and eye closing.  Sleep was characterized by vertex waves, sleep spindles (12 to 14 Hz), maximal frontocentral region.  Hyperventilation and photic stimulation were not performed.     IMPRESSION: This study is within normal limits. No seizures or epileptiform discharges were seen throughout the recording.  Zachary Medina

## 2020-07-16 NOTE — Discharge Summary (Signed)
Physician Discharge Summary  Keigo Schimpf [id number] DOB: June 29, 1950 DOA: 07/15/2020  PCP: Jerrol Banana., MD  Admit date: 07/15/2020 Discharge date: 07/16/2020  Admitted From: home Discharge disposition: home   Recommendations for Outpatient Follow-Up:   1. Outpatient EP follow up in 6 weeks 2. See ASA/plavix below   Discharge Diagnosis:   Active Problems:   TIA (transient ischemic attack)   Bradycardia   Transient ischemic attack    Discharge Condition: Improved.  Diet recommendation: Low sodium, heart healthy  Wound care: None.  Code status: Full.   History of Present Illness:   *Morice Kuczkowski is a 70 y.o. male with PMH significant for HLD, Kidney stones, GERD, Hepatitis, prior TIA, OSA on CPAP who presents with an episode of aphasia + R face and hand numbness x 5 mins then self resolved. His neurologic examination is normal with no focal deficit and NIHSS of 0.   Hospital Course by Problem:   TIA Aspirin 81mg  daily along with plavix 75mg  daily x 21 days, followed by aspirin 81mg  daily alone. -neurology consult appreciated -EEG normal -echo as below  OSA -CPAP QHS  Hx of bradycardia -EP Consult with outpatient EP follow up: Pt with nocturnal bradycardia in 40s but HRs 50-70s while awake and active around room.  No current indication for pacing with no clear correlation between bradycardia and symptoms.    Medical Consultants:   Neuro Cards/EP   Discharge Exam:   Vitals:   07/16/20 0453 07/16/20 1200  BP: 127/71 (!) 141/74  Pulse: 74 (!) 58  Resp: 18 18  Temp: 97.8 F (36.6 C) 98.1 F (36.7 C)  SpO2: 96% 100%   Vitals:   07/15/20 2005 07/16/20 0025 07/16/20 0453 07/16/20 1200  BP: (!) 141/86 130/73 127/71 (!) 141/74  Pulse: (!) 51 (!) 53 74 (!) 58  Resp: 18 18 18 18   Temp: 98.1 F (36.7 C) 97.8 F (36.6 C) 97.8 F (36.6 C) 98.1 F (36.7 C)  TempSrc: Oral Oral Oral Oral  SpO2: 97% 97% 96% 100%  Weight:       Height:        General exam: Appears calm and comfortable.    The results of significant diagnostics from this hospitalization (including imaging, microbiology, ancillary and laboratory) are listed below for reference.     Procedures and Diagnostic Studies:   CT HEAD WO CONTRAST  Result Date: 07/15/2020 CLINICAL DATA:  Transient ischemic attack (TIA) EXAM: CT HEAD WITHOUT CONTRAST TECHNIQUE: Contiguous axial images were obtained from the base of the skull through the vertex without intravenous contrast. COMPARISON:  None. FINDINGS: Brain: No evidence of acute infarction, hemorrhage, hydrocephalus, extra-axial collection or mass lesion/mass effect. Scattered subcortical and periventricular white matter hypodensities, which is nonspecific but likely sequela of chronic small vessel ischemic disease. Vascular: No hyperdense vessel.  Carotid calcifications. Skull: Normal. Negative for fracture or focal lesion. Sinuses/Orbits: No acute finding. Other: None. IMPRESSION: No acute intracranial abnormality. Electronically Signed   By: Maurine Simmering   On: 07/15/2020 12:15   MR ANGIO HEAD WO CONTRAST  Result Date: 07/15/2020 CLINICAL DATA:  Stroke follow-up. EXAM: MRI HEAD WITHOUT CONTRAST MRA HEAD WITHOUT CONTRAST MRA NECK WITHOUT CONTRAST TECHNIQUE: Multiplanar, multiecho pulse sequences of the brain and surrounding structures were obtained without intravenous contrast. Angiographic images of the Circle of Willis were obtained using MRA technique without intravenous contrast. Angiographic images of the neck were obtained using MRA technique without intravenous contrast. Carotid stenosis measurements (when applicable)  are obtained utilizing NASCET criteria, using the distal internal carotid diameter as the denominator. COMPARISON:  CT head 07/15/2020 FINDINGS: MRI HEAD FINDINGS Brain: Negative for acute infarct. Moderate white matter changes with scattered small deep white matter hyperintensities bilaterally.  Brainstem and cerebellum intact. Negative for hemorrhage or mass. Ventricle size normal. Vascular: Normal arterial flow voids Skull and upper cervical spine: Negative Sinuses/Orbits: Negative Other: None MRA HEAD FINDINGS Both vertebral arteries patent to the basilar. PICA patent bilaterally. Superior cerebellar and posterior cerebral arteries patent without stenosis. Internal carotid artery is widely patent bilaterally. Anterior and middle cerebral arteries widely patent bilaterally. Negative for stenosis. Negative for cerebral aneurysm. MRA NECK FINDINGS Antegrade flow in the carotid and vertebral arteries bilaterally. Carotid bifurcation widely patent bilaterally without stenosis. Both vertebral arteries widely patent without stenosis. IMPRESSION: 1. Negative for acute infarct. Moderate white matter changes consistent with chronic microvascular ischemia 2. Negative MRA head 3. Negative MRA neck Electronically Signed   By: Franchot Gallo M.D.   On: 07/15/2020 16:58   MR ANGIO NECK WO CONTRAST  Result Date: 07/15/2020 CLINICAL DATA:  Stroke follow-up. EXAM: MRI HEAD WITHOUT CONTRAST MRA HEAD WITHOUT CONTRAST MRA NECK WITHOUT CONTRAST TECHNIQUE: Multiplanar, multiecho pulse sequences of the brain and surrounding structures were obtained without intravenous contrast. Angiographic images of the Circle of Willis were obtained using MRA technique without intravenous contrast. Angiographic images of the neck were obtained using MRA technique without intravenous contrast. Carotid stenosis measurements (when applicable) are obtained utilizing NASCET criteria, using the distal internal carotid diameter as the denominator. COMPARISON:  CT head 07/15/2020 FINDINGS: MRI HEAD FINDINGS Brain: Negative for acute infarct. Moderate white matter changes with scattered small deep white matter hyperintensities bilaterally. Brainstem and cerebellum intact. Negative for hemorrhage or mass. Ventricle size normal. Vascular: Normal  arterial flow voids Skull and upper cervical spine: Negative Sinuses/Orbits: Negative Other: None MRA HEAD FINDINGS Both vertebral arteries patent to the basilar. PICA patent bilaterally. Superior cerebellar and posterior cerebral arteries patent without stenosis. Internal carotid artery is widely patent bilaterally. Anterior and middle cerebral arteries widely patent bilaterally. Negative for stenosis. Negative for cerebral aneurysm. MRA NECK FINDINGS Antegrade flow in the carotid and vertebral arteries bilaterally. Carotid bifurcation widely patent bilaterally without stenosis. Both vertebral arteries widely patent without stenosis. IMPRESSION: 1. Negative for acute infarct. Moderate white matter changes consistent with chronic microvascular ischemia 2. Negative MRA head 3. Negative MRA neck Electronically Signed   By: Franchot Gallo M.D.   On: 07/15/2020 16:58   MR BRAIN WO CONTRAST  Result Date: 07/15/2020 CLINICAL DATA:  Stroke follow-up. EXAM: MRI HEAD WITHOUT CONTRAST MRA HEAD WITHOUT CONTRAST MRA NECK WITHOUT CONTRAST TECHNIQUE: Multiplanar, multiecho pulse sequences of the brain and surrounding structures were obtained without intravenous contrast. Angiographic images of the Circle of Willis were obtained using MRA technique without intravenous contrast. Angiographic images of the neck were obtained using MRA technique without intravenous contrast. Carotid stenosis measurements (when applicable) are obtained utilizing NASCET criteria, using the distal internal carotid diameter as the denominator. COMPARISON:  CT head 07/15/2020 FINDINGS: MRI HEAD FINDINGS Brain: Negative for acute infarct. Moderate white matter changes with scattered small deep white matter hyperintensities bilaterally. Brainstem and cerebellum intact. Negative for hemorrhage or mass. Ventricle size normal. Vascular: Normal arterial flow voids Skull and upper cervical spine: Negative Sinuses/Orbits: Negative Other: None MRA HEAD FINDINGS  Both vertebral arteries patent to the basilar. PICA patent bilaterally. Superior cerebellar and posterior cerebral arteries patent without stenosis. Internal carotid artery is widely  patent bilaterally. Anterior and middle cerebral arteries widely patent bilaterally. Negative for stenosis. Negative for cerebral aneurysm. MRA NECK FINDINGS Antegrade flow in the carotid and vertebral arteries bilaterally. Carotid bifurcation widely patent bilaterally without stenosis. Both vertebral arteries widely patent without stenosis. IMPRESSION: 1. Negative for acute infarct. Moderate white matter changes consistent with chronic microvascular ischemia 2. Negative MRA head 3. Negative MRA neck Electronically Signed   By: Franchot Gallo M.D.   On: 07/15/2020 16:58   EEG adult  Result Date: 07/16/2020 Lora Havens, MD     07/16/2020  4:01 PM Patient Name: Cohen Boettner MRN: [id number] Epilepsy Attending: Lora Havens Referring Physician/Provider: Dr. Donnetta Simpers Date: 07/16/2020 Duration: 22.50 mins Patient history: 70 year old male with transient episode of aphasia, right face and hand numbness.  EEG to evaluate for seizures. Level of alertness: Awake,asleep AEDs during EEG study: None Technical aspects: This EEG study was done with scalp electrodes positioned according to the 10-20 International system of electrode placement. Electrical activity was acquired at a sampling rate of 500Hz  and reviewed with a high frequency filter of 70Hz  and a low frequency filter of 1Hz . EEG data were recorded continuously and digitally stored. Description: The posterior dominant rhythm consists of 9-10 Hz activity of moderate voltage (25-35 uV) seen predominantly in posterior head regions, symmetric and reactive to eye opening and eye closing.  Sleep was characterized by vertex waves, sleep spindles (12 to 14 Hz), maximal frontocentral region.  Hyperventilation and photic stimulation were not performed.   IMPRESSION: This study is  within normal limits. No seizures or epileptiform discharges were seen throughout the recording. Lora Havens   ECHOCARDIOGRAM COMPLETE  Result Date: 07/16/2020    ECHOCARDIOGRAM REPORT   Patient Name:   JRU PENSE Date of Exam: 07/16/2020 Medical Rec #:  [id number]     Height:       68.0 in Accession #:    [id number]    Weight:       169.0 lb Date of Birth:  1950-08-28      BSA:          1.903 m Patient Age:    70 years      BP:           141/74 mmHg Patient Gender: M             HR:           58 bpm. Exam Location:  Inpatient Procedure: 2D Echo, Cardiac Doppler and Color Doppler Indications:    TIA  History:        Patient has no prior history of Echocardiogram examinations.                 TIA, Arrythmias:Bradycardia; Risk Factors:Dyslipidemia and Sleep                 Apnea.  Sonographer:    Dustin Flock Referring Phys: [id number] [place]  1. Left ventricular ejection fraction, by estimation, is 55 to 60%. The left ventricle has normal function. The left ventricle has no regional wall motion abnormalities. Left ventricular diastolic parameters are consistent with Grade I diastolic dysfunction (impaired relaxation).  2. Right ventricular systolic function is normal. The right ventricular size is normal. There is normal pulmonary artery systolic pressure.  3. The mitral valve is normal in structure. No evidence of mitral valve regurgitation. No evidence of mitral stenosis.  4. The aortic valve is tricuspid. Aortic valve regurgitation is trivial.  5. The  inferior vena cava is normal in size with greater than 50% respiratory variability, suggesting right atrial pressure of 3 mmHg. Comparison(s): No prior Echocardiogram. FINDINGS  Left Ventricle: Left ventricular ejection fraction, by estimation, is 55 to 60%. The left ventricle has normal function. The left ventricle has no regional wall motion abnormalities. The left ventricular internal cavity size was normal in size. There is  no left  ventricular hypertrophy. Left ventricular diastolic parameters are consistent with Grade I diastolic dysfunction (impaired relaxation). Right Ventricle: The right ventricular size is normal. No increase in right ventricular wall thickness. Right ventricular systolic function is normal. There is normal pulmonary artery systolic pressure. The tricuspid regurgitant velocity is 1.95 m/s, and  with an assumed right atrial pressure of 3 mmHg, the estimated right ventricular systolic pressure is 123XX123 mmHg. Left Atrium: Left atrial size was normal in size. Right Atrium: Right atrial size was normal in size. Pericardium: There is no evidence of pericardial effusion. Mitral Valve: The mitral valve is normal in structure. No evidence of mitral valve regurgitation. No evidence of mitral valve stenosis. Tricuspid Valve: The tricuspid valve is normal in structure. Tricuspid valve regurgitation is not demonstrated. No evidence of tricuspid stenosis. Aortic Valve: The aortic valve is tricuspid. Aortic valve regurgitation is trivial. Aortic regurgitation PHT measures 1112 msec. Pulmonic Valve: The pulmonic valve was not well visualized. Pulmonic valve regurgitation is not visualized. No evidence of pulmonic stenosis. Aorta: The aortic root is normal in size and structure. Venous: The inferior vena cava is normal in size with greater than 50% respiratory variability, suggesting right atrial pressure of 3 mmHg. IAS/Shunts: The atrial septum is grossly normal.  LEFT VENTRICLE PLAX 2D LVIDd:         5.10 cm  Diastology LVIDs:         2.80 cm  LV e' medial:    5.66 cm/s LV PW:         1.20 cm  LV E/e' medial:  9.1 LV IVS:        1.00 cm  LV e' lateral:   7.72 cm/s LVOT diam:     2.30 cm  LV E/e' lateral: 6.7 LV SV:         68 LV SV Index:   36 LVOT Area:     4.15 cm  RIGHT VENTRICLE RV Basal diam:  2.60 cm RV S prime:     15.30 cm/s TAPSE (M-mode): 2.6 cm LEFT ATRIUM             Index       RIGHT ATRIUM           Index LA diam:         4.60 cm 2.42 cm/m  RA Area:     14.40 cm LA Vol (A2C):   57.2 ml 30.06 ml/m RA Volume:   36.40 ml  19.13 ml/m LA Vol (A4C):   53.6 ml 28.17 ml/m LA Biplane Vol: 56.4 ml 29.64 ml/m  AORTIC VALVE LVOT Vmax:   91.40 cm/s LVOT Vmean:  52.700 cm/s LVOT VTI:    0.164 m AI PHT:      1112 msec  AORTA Ao Root diam: 3.20 cm MITRAL VALVE               TRICUSPID VALVE MV Area (PHT): 2.60 cm    TR Peak grad:   15.2 mmHg MV Decel Time: 292 msec    TR Vmax:        195.00 cm/s MV E  velocity: 51.40 cm/s MV A velocity: 58.30 cm/s  SHUNTS MV E/A ratio:  0.88        Systemic VTI:  0.16 m                            Systemic Diam: 2.30 cm Rudean Haskell MD Electronically signed by Rudean Haskell MD Signature Date/Time: 07/16/2020/4:06:27 PM    Final      Labs:   Basic Metabolic Panel: Recent Labs  Lab 07/15/20 1052 07/15/20 1056 07/15/20 1652  NA 138 141  --   K 4.2 4.2  --   CL 105 104  --   CO2 27  --   --   GLUCOSE 129* 127*  --   BUN 21 21  --   CREATININE 1.28* 1.30*  --   CALCIUM 9.0  --   --   MG  --   --  2.3  PHOS  --   --  3.1   GFR Estimated Creatinine Clearance: 51.2 mL/min (A) (by C-G formula based on SCr of 1.3 mg/dL (H)). Liver Function Tests: Recent Labs  Lab 07/15/20 1052  AST 21  ALT 22  ALKPHOS 53  BILITOT 2.2*  PROT 6.4*  ALBUMIN 3.8   No results for input(s): LIPASE, AMYLASE in the last 168 hours. No results for input(s): AMMONIA in the last 168 hours. Coagulation profile Recent Labs  Lab 07/15/20 1052  INR 1.0    CBC: Recent Labs  Lab 07/15/20 1052 07/15/20 1056  WBC 4.3  --   NEUTROABS 2.6  --   HGB 15.7 15.3  HCT 48.0 45.0  MCV 95.0  --   PLT 140*  --    Cardiac Enzymes: No results for input(s): CKTOTAL, CKMB, CKMBINDEX, TROPONINI in the last 168 hours. BNP: Invalid input(s): POCBNP CBG: Recent Labs  Lab 07/15/20 1328  GLUCAP 86   D-Dimer No results for input(s): DDIMER in the last 72 hours. Hgb A1c Recent Labs     07/16/20 0224  HGBA1C 6.0*   Lipid Profile Recent Labs    07/16/20 0224  CHOL 189  HDL 30*  LDLCALC 132*  TRIG 136  CHOLHDL 6.3   Thyroid function studies Recent Labs    07/15/20 1652  TSH 2.052   Anemia work up No results for input(s): VITAMINB12, FOLATE, FERRITIN, TIBC, IRON, RETICCTPCT in the last 72 hours. Microbiology Recent Results (from the past 240 hour(s))  SARS CORONAVIRUS 2 (TAT 6-24 HRS) Nasopharyngeal Nasopharyngeal Swab     Status: None   Collection Time: 07/16/20 12:05 AM   Specimen: Nasopharyngeal Swab  Result Value Ref Range Status   SARS Coronavirus 2 NEGATIVE NEGATIVE Final    Comment: (NOTE) SARS-CoV-2 target nucleic acids are NOT DETECTED.  The SARS-CoV-2 RNA is generally detectable in upper and lower respiratory specimens during the acute phase of infection. Negative results do not preclude SARS-CoV-2 infection, do not rule out co-infections with other pathogens, and should not be used as the sole basis for treatment or other patient management decisions. Negative results must be combined with clinical observations, patient history, and epidemiological information. The expected result is Negative.  Fact Sheet for Patients: SugarRoll.be  Fact Sheet for Healthcare Providers: https://www.woods-mathews.com/  This test is not yet approved or cleared by the [place] FDA and  has been authorized for detection and/or diagnosis of SARS-CoV-2 by FDA under an Emergency Use Authorization (EUA). This EUA will remain  in effect (meaning  this test can be used) for the duration of the COVID-19 declaration under Se ction 564(b)(1) of the Act, 21 U.S.C. section 360bbb-3(b)(1), unless the authorization is terminated or revoked sooner.  Performed at [facility], [place] [address] [place], [place] [address]      Discharge Instructions:   Discharge Instructions    Ambulatory referral to Neurology    Complete by: As directed    An appointment is requested in approximately: {4-6 weeks   Diet - low sodium heart healthy   Complete by: As directed    Increase activity slowly   Complete by: As directed      Allergies as of 07/16/2020   No Known Allergies     Medication List    STOP taking these medications   amoxicillin 875 MG tablet Commonly known as: AMOXIL   MAGNESIUM PO     TAKE these medications   aspirin 81 MG tablet Take 81 mg by mouth daily.   atorvastatin 80 MG tablet Commonly known as: LIPITOR Take 1 tablet (80 mg total) by mouth daily. Start taking on: Jul 17, 2020   clopidogrel 75 MG tablet Commonly known as: PLAVIX Take 1 tablet (75 mg total) by mouth daily. Start taking on: Jul 17, 2020   magnesium oxide 400 MG tablet Commonly known as: MAG-OX Take 1 tablet (400 mg total) by mouth at bedtime.   meloxicam 15 MG tablet Commonly known as: MOBIC Take 1 tablet (15 mg total) by mouth daily.   nitroGLYCERIN 0.4 MG SL tablet Commonly known as: NITROSTAT Place 1 tablet (0.4 mg total) under the tongue every 5 (five) minutes as needed for up to 25 doses for chest pain.   Omega 3 1000 MG Caps Take 1,000 mg by mouth daily.   VITAMIN B-12 PO Take 1 tablet by mouth daily.   VITAMIN C PO Take 1 tablet by mouth daily.   Vitamin D (Cholecalciferol) 25 MCG (1000 UT) Tabs Take 2,000 Units by mouth daily.       Follow-up Information    Constance Haw, MD Follow up.   Specialty: Cardiology Why: on 6/16 at 0930 for post hospital follow up. Contact information: [address] [place] [address] [phone]                Time coordinating discharge: 25 min  Signed:  Geradine Girt DO  [facility] 07/16/2020, 4:17 PM

## 2020-07-16 NOTE — Evaluation (Signed)
Occupational Therapy Evaluation Patient Details Name: Zachary Medina MRN: [id number] DOB: 09-30-1950 Today's Date: 07/16/2020    History of Present Illness 70 y.o. male presenting with stroke-like symptoms including blurry vision, temporary confusion, expressive aphasia, R facial numbness, and R-sided weakness for several minutes. All symptoms have resolved. CT and MRI (-) for acute infarct. PMHx significant for bradycardia w/ frequent PVC, TIA and OSA on CPAP.   Clinical Impression   Prior to admission, patient was living with his wife and was independent with ADL's/IADLs including working full-time as a Theme park manager. Patient currently functioning at baseline demonstrating observed ADLs, transfers, and functional mobility with I. Strength, coordination, sensation and vision intact. No observed or patient reported deficits. Patient does not need continued acute occupational therapy services with OT to sign off at this time.     Follow Up Recommendations  No OT follow up    Equipment Recommendations  None recommended by OT    Recommendations for Other Services       Precautions / Restrictions Precautions Precautions: None Restrictions Weight Bearing Restrictions: No      Mobility Bed Mobility               General bed mobility comments: Patient seated EOB upon entry.    Transfers Overall transfer level: Independent Equipment used: None                  Balance Overall balance assessment: Independent                                         ADL either performed or assessed with clinical judgement   ADL Overall ADL's : Independent                                       General ADL Comments: Patient completed grooming tasks standing at sink and UB/LB dressing with I.     Vision Baseline Vision/History: Wears glasses Wears Glasses: At all times Patient Visual Report: No change from baseline Vision Assessment?: No apparent visual  deficits Additional Comments: Blurry vision has resolved.     Perception     Praxis      Pertinent Vitals/Pain Pain Assessment: No/denies pain     Hand Dominance Right   Extremity/Trunk Assessment Upper Extremity Assessment Upper Extremity Assessment: Overall WFL for tasks assessed   Lower Extremity Assessment Lower Extremity Assessment: Overall WFL for tasks assessed   Cervical / Trunk Assessment Cervical / Trunk Assessment: Normal   Communication Communication Communication: No difficulties   Cognition Arousal/Alertness: Awake/alert Behavior During Therapy: WFL for tasks assessed/performed Overall Cognitive Status: Within Functional Limits for tasks assessed                                     General Comments       Exercises     Shoulder Instructions      Home Living Family/patient expects to be discharged to:: Private residence Living Arrangements: Spouse/significant other Available Help at Discharge: Family Type of Home: House Home Access: Stairs to enter Technical brewer of Steps: 3 Entrance Stairs-Rails: Left (Ascending) Home Layout: Two level;Bed/bath upstairs Alternate Level Stairs-Number of Steps: Full flight   Bathroom Shower/Tub: Occupational psychologist: Standard  Home Equipment: [place] - 2 wheels;Cane - single point          Prior Functioning/Environment Level of Independence: Independent        Comments: Works full-time as a Theme park manager; drives; enjoys walking, reading, and horseback riding. Used to own horses.        OT Problem List:        OT Treatment/Interventions:      OT Goals(Current goals can be found in the care plan section) Acute Rehab OT Goals Patient Stated Goal: Tor return home. OT Goal Formulation: With patient  OT Frequency:     Barriers to D/C:            Co-evaluation              AM-PAC OT "6 Clicks" Daily Activity     Outcome Measure Help from another person  eating meals?: None Help from another person taking care of personal grooming?: None Help from another person toileting, which includes using toliet, bedpan, or urinal?: None Help from another person bathing (including washing, rinsing, drying)?: None Help from another person to put on and taking off regular upper body clothing?: None Help from another person to put on and taking off regular lower body clothing?: None 6 Click Score: 24   End of Session Equipment Utilized During Treatment: Gait belt Nurse Communication: Other (comment) (Patient to be independent in room.)  Activity Tolerance: Patient tolerated treatment well Patient left: in chair;with call bell/phone within reach  OT Visit Diagnosis: Muscle weakness (generalized) (M62.81)                Time: [id number] OT Time Calculation (min): 17 min Charges:  OT General Charges $OT Visit: 1 Visit OT Evaluation $OT Eval Low Complexity: 1 Low  Aviv Rota H. OTR/L Supplemental OT, Department of rehab services [phone]  Maressa Apollo R H. 07/16/2020, 8:45 AM

## 2020-07-16 NOTE — TOC Initial Note (Signed)
Transition of Care [facility]) - Initial/Assessment Note    Patient Details  Name: Zachary Medina MRN: [id number] Date of Birth: 10/28/1950  Transition of Care [facility]) CM/SW Contact:    Verdell Carmine, RN Phone Number: 07/16/2020, 3:57 PM  Clinical Narrative:                 Was in a meeting and had a aphasic event could not get out hte name of his dog. Had a episode similar a number of years ago work up for TIA at thiat time. ECHO done will do MRI. Neurology consult. CM will follow for needs  Expected Discharge Plan: Home/Self Care Barriers to Discharge: Continued Medical Work up   Patient Goals and CMS Choice        Expected Discharge Plan and Services Expected Discharge Plan: Home/Self Care   Discharge Planning Services: CM Consult   Living arrangements for the past 2 months: Single Family Home                                      Prior Living Arrangements/Services Living arrangements for the past 2 months: Single Family Home Lives with:: Spouse Patient language and need for interpreter reviewed:: Yes        Need for Family Participation in Patient Care: Yes (Comment) Care giver support system in place?: Yes (comment)   Criminal Activity/Legal Involvement Pertinent to Current Situation/Hospitalization: No - Comment as needed  Activities of Daily Living Home Assistive Devices/Equipment: None ADL Screening (condition at time of admission) Patient's cognitive ability adequate to safely complete daily activities?: Yes Is the patient deaf or have difficulty hearing?: No Does the patient have difficulty seeing, even when wearing glasses/contacts?: No Does the patient have difficulty concentrating, remembering, or making decisions?: No Patient able to express need for assistance with ADLs?: No Does the patient have difficulty dressing or bathing?: No Independently performs ADLs?: Yes (appropriate for developmental age) Does the patient have difficulty walking or  climbing stairs?: No Weakness of Legs: None Weakness of Arms/Hands: None  Permission Sought/Granted                  Emotional Assessment       Orientation: : Oriented to Self,Oriented to Place,Oriented to  Time,Oriented to Situation Alcohol / Substance Use: Not Applicable Psych Involvement: No (comment)  Admission diagnosis:  TIA (transient ischemic attack) [G45.9] Bradycardia [R00.1] Patient Active Problem List   Diagnosis Date Noted  . TIA (transient ischemic attack) 07/15/2020  . Bradycardia 07/15/2020  . Irregular heartbeat 07/11/2017  . Palpitations 05/21/2017  . Gastroenteritis 04/26/2016  . Allergic rhinitis 07/16/2014  . Acid reflux 07/16/2014  . 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 H/O renal calculi 07/16/2014  . H/O transient cerebral ischemia 07/16/2014  . Obstructive apnea 07/16/2014  . HLD (hyperlipidemia) 07/16/2014  . Avitaminosis D 07/16/2014   PCP:  Jerrol Banana., MD Pharmacy:   [id number] [address], [place] AT [address] [place] [address] Phone: [phone] Fax: [phone]     Social Determinants of Health (SDOH) Interventions    Readmission Risk Interventions No flowsheet data found.

## 2020-08-27 ENCOUNTER — Ambulatory Visit: Payer: Medicare HMO | Admitting: Cardiology

## 2020-08-27 ENCOUNTER — Encounter: Payer: Self-pay | Admitting: Cardiology

## 2020-08-27 ENCOUNTER — Other Ambulatory Visit: Payer: Self-pay

## 2020-08-27 VITALS — BP 126/64 | HR 54 | Ht 68.0 in | Wt 172.8 lb

## 2020-08-27 DIAGNOSIS — I493 Ventricular premature depolarization: Secondary | ICD-10-CM

## 2020-08-27 NOTE — Progress Notes (Signed)
Electrophysiology Office Note   Date:  08/27/2020   ID:  Grason Brailsford, DOB [birth year], MRN [id number]  PCP:  Jerrol Banana., MD  Cardiologist:  Rockey Situ Primary Electrophysiologist:  Devan Danzer Meredith Leeds, MD    Chief Complaint: Bradycardia   History of Present Illness: Kavir Savoca is a 70 y.o. male who is being seen today for the evaluation of bradycardia at the request of Jerrol Banana.,*. Presenting today for electrophysiology evaluation.  He has a history of hyperlipidemia, TIA, OSA on CPAP, GERD.  He presented to the hospital 07/15/2020 with transient right hand and face numbness and weakness.  He was found to have a possible TIA.  He was also noted to be bradycardic with PVCs.  It appeared that he was having post compensatory pauses.  He has a longstanding history of bradycardia.  As he was minimally symptomatic, no pacemaker was implanted.  Today, he denies symptoms of palpitations, chest pain, shortness of breath, orthopnea, PND, lower extremity edema, claudication, dizziness, presyncope, syncope, bleeding, or neurologic sequela. The patient is tolerating medications without difficulties.  He currently feels well.  He has no chest pain or shortness of breath.  Is able do all of his daily activities without restriction.  He walks with his wife up to 30 minutes a day.  He has no issues with that.  He does state that he gets tired at times, though overall feels well.   Past Medical History:  Diagnosis Date   GERD (gastroesophageal reflux disease)    Hepatitis    Hyperlipidemia    Kidney stones    TIA (transient ischemic attack)    Past Surgical History:  Procedure Laterality Date   CHOLECYSTECTOMY     COLONOSCOPY N/A 07/29/2014   Procedure: COLONOSCOPY;  Surgeon: Lucilla Lame, MD;  Location: [facility] ENDOSCOPY;  Service: Endoscopy;  Laterality: N/A;   HERNIA REPAIR       Current Outpatient Medications  Medication Sig Dispense Refill   Ascorbic Acid (VITAMIN C PO)  Take 1 tablet by mouth daily.     aspirin 81 MG tablet Take 81 mg by mouth daily.      Cyanocobalamin (VITAMIN B-12 PO) Take 1 tablet by mouth daily.     magnesium oxide (MAG-OX) 400 MG tablet Take 1 tablet (400 mg total) by mouth at bedtime. 30 tablet 11   meloxicam (MOBIC) 15 MG tablet Take 1 tablet (15 mg total) by mouth daily. 30 tablet 3   nitroGLYCERIN (NITROSTAT) 0.4 MG SL tablet Place 1 tablet (0.4 mg total) under the tongue every 5 (five) minutes as needed for up to 25 doses for chest pain. 25 tablet 4   Omega 3 1000 MG CAPS Take 1,000 mg by mouth daily.     Vitamin D, Cholecalciferol, 1000 units TABS Take 2,000 Units by mouth daily.      atorvastatin (LIPITOR) 80 MG tablet Take 1 tablet (80 mg total) by mouth daily. (Patient not taking: No sig reported) 30 tablet 0   No current facility-administered medications for this visit.    Allergies:   Patient has no known allergies.   Social History:  The patient  reports that he has never smoked. He has never used smokeless tobacco. He reports that he does not drink alcohol and does not use drugs.   Family History:  The patient's family history includes Arthritis in his father and mother; Congestive Heart Failure in his father; Diabetes in his brother; Glaucoma in his mother; Heart disease in his  father; Parkinson's disease in his father; Prostate cancer in his father.    ROS:  Please see the history of present illness.   Otherwise, review of systems is positive for none.   All other systems are reviewed and negative.    PHYSICAL EXAM: VS:  BP 126/64   Pulse (!) 54   Ht 5\' 8"  (1.727 m)   Wt 172 lb 12.8 oz (78.4 kg)   SpO2 97%   BMI 26.27 kg/m  , BMI Body mass index is 26.27 kg/m. GEN: Well nourished, well developed, in no acute distress  HEENT: normal  Neck: no JVD, carotid bruits, or masses Cardiac: RRR; no murmurs, rubs, or gallops,no edema  Respiratory:  clear to auscultation bilaterally, normal work of breathing GI: soft,  nontender, nondistended, + BS MS: no deformity or atrophy  Skin: warm and dry Neuro:  Strength and sensation are intact Psych: euthymic mood, full affect  EKG:  EKG is ordered today. Personal review of the ekg ordered shows sinus rhythm, rate 54  Recent Labs: 07/15/2020: ALT 22; BUN 21; Creatinine, Ser 1.30; Hemoglobin 15.3; Magnesium 2.3; Platelets 140; Potassium 4.2; Sodium 141; TSH 2.052    Lipid Panel     Component Value Date/Time   CHOL 189 07/16/2020 0224   CHOL 177 12/09/2019 1059   TRIG 136 07/16/2020 0224   HDL 30 (L) 07/16/2020 0224   HDL 33 (L) 12/09/2019 1059   CHOLHDL 6.3 07/16/2020 0224   VLDL 27 07/16/2020 0224   LDLCALC 132 (H) 07/16/2020 0224   LDLCALC 119 (H) 12/09/2019 1059     Wt Readings from Last 3 Encounters:  08/27/20 172 lb 12.8 oz (78.4 kg)  07/15/20 169 lb (76.7 kg)  05/25/20 174 lb (78.9 kg)      Other studies Reviewed: Additional studies/ records that were reviewed today include: TTE 5/5/222  Review of the above records today demonstrates:   1. Left ventricular ejection fraction, by estimation, is 55 to 60%. The  left ventricle has normal function. The left ventricle has no regional  wall motion abnormalities. Left ventricular diastolic parameters are  consistent with Grade I diastolic  dysfunction (impaired relaxation).   2. Right ventricular systolic function is normal. The right ventricular  size is normal. There is normal pulmonary artery systolic pressure.   3. The mitral valve is normal in structure. No evidence of mitral valve  regurgitation. No evidence of mitral stenosis.   4. The aortic valve is tricuspid. Aortic valve regurgitation is trivial.   5. The inferior vena cava is normal in size with greater than 50%  respiratory variability, suggesting right atrial pressure of 3 mmHg.    ASSESSMENT AND PLAN:  1.  Sinus bradycardia with PVCs: Not having PVCs today.  His heart rate is in the 50s.  He currently feels well and is  without complaint.  He is able to exert himself, walking up to 30 minutes a day.  At this point, no indication for pacemaker implant.  2.  TIA: Patient is fully recovered without continued symptoms.  Jenesis Martin discuss with neurology whether or not Linq monitor would be indicated.  Current medicines are reviewed at length with the patient today.   The patient does not have concerns regarding his medicines.  The following changes were made today:  none  Labs/ tests ordered today include:  Orders Placed This Encounter  Procedures   EKG 12-Lead     Disposition:   FU with Jannis Atkins as needed  Signed, Jalesha Plotz Hassell Done  Shandale Malak, MD  08/27/2020 10:00 AM     [facility] [address] [place] [address] [phone] (office) [phone] (fax)

## 2020-12-15 ENCOUNTER — Ambulatory Visit: Payer: Medicare HMO

## 2020-12-21 DIAGNOSIS — H2513 Age-related nuclear cataract, bilateral: Secondary | ICD-10-CM | POA: Diagnosis not present

## 2020-12-21 DIAGNOSIS — H43393 Other vitreous opacities, bilateral: Secondary | ICD-10-CM | POA: Diagnosis not present

## 2020-12-21 DIAGNOSIS — H0288B Meibomian gland dysfunction left eye, upper and lower eyelids: Secondary | ICD-10-CM | POA: Diagnosis not present

## 2020-12-21 DIAGNOSIS — H0288A Meibomian gland dysfunction right eye, upper and lower eyelids: Secondary | ICD-10-CM | POA: Diagnosis not present

## 2020-12-22 ENCOUNTER — Encounter: Payer: Self-pay | Admitting: Family Medicine

## 2020-12-22 ENCOUNTER — Other Ambulatory Visit: Payer: Self-pay

## 2020-12-22 ENCOUNTER — Ambulatory Visit (INDEPENDENT_AMBULATORY_CARE_PROVIDER_SITE_OTHER): Payer: Medicare HMO | Admitting: Family Medicine

## 2020-12-22 VITALS — BP 119/68 | HR 59 | Temp 98.4°F | Resp 16 | Ht 68.0 in | Wt 170.0 lb

## 2020-12-22 DIAGNOSIS — N400 Enlarged prostate without lower urinary tract symptoms: Secondary | ICD-10-CM | POA: Diagnosis not present

## 2020-12-22 DIAGNOSIS — Z0000 Encounter for general adult medical examination without abnormal findings: Secondary | ICD-10-CM | POA: Diagnosis not present

## 2020-12-22 NOTE — Progress Notes (Signed)
I,April Miller,acting as a scribe for Wilhemena Durie, MD.,have documented all relevant documentation on the behalf of Wilhemena Durie, MD,as directed by  Wilhemena Durie, MD while in the presence of Wilhemena Durie, MD.  Annual Wellness Visit     Patient: Zachary Medina, Male    DOB: 07/03/1950, 70 y.o.   MRN: [id number] Visit Date: 12/22/2020  Today's Provider: Wilhemena Durie, MD   Chief Complaint  Patient presents with   Annual Exam   Subjective    Zachary Medina is a 70 y.o. male who presents today for his Annual Wellness Visit.Annual Physical. He reports consuming a general diet. Home exercise routine includes walking and ciliastatics. He generally feels well. He reports sleeping fairly well. He does not have additional problems to discuss today.   HPI Patient does have some BPH symptoms with urinary hesitancy.   Medications: Outpatient Medications Prior to Visit  Medication Sig   Ascorbic Acid (VITAMIN C PO) Take 1 tablet by mouth daily.   aspirin 81 MG tablet Take 81 mg by mouth daily.    Cyanocobalamin (VITAMIN B-12 PO) Take 1 tablet by mouth daily.   magnesium oxide (MAG-OX) 400 MG tablet Take 1 tablet (400 mg total) by mouth at bedtime.   nitroGLYCERIN (NITROSTAT) 0.4 MG SL tablet Place 1 tablet (0.4 mg total) under the tongue every 5 (five) minutes as needed for up to 25 doses for chest pain.   Omega 3 1000 MG CAPS Take 1,000 mg by mouth daily.   Vitamin D, Cholecalciferol, 1000 units TABS Take 2,000 Units by mouth daily.    atorvastatin (LIPITOR) 80 MG tablet Take 1 tablet (80 mg total) by mouth daily. (Patient not taking: No sig reported)   [DISCONTINUED] meloxicam (MOBIC) 15 MG tablet Take 1 tablet (15 mg total) by mouth daily. (Patient not taking: Reported on 12/22/2020)   No facility-administered medications prior to visit.    No Known Allergies  Patient Care Team: Jerrol Banana., MD as PCP - General (Family Medicine) Minna Merritts, MD as PCP - Cardiology (Cardiology) Beverly Gust, MD (Otolaryngology) Dwain Sarna, [place] as Referring Physician (Optometry) Hosie Spangle, MD as Referring Physician (Dermatology) Dasher, Rayvon Char, MD (Dermatology)  Review of Systems  HENT:  Positive for drooling and hearing loss.   Genitourinary:  Positive for difficulty urinating.  Psychiatric/Behavioral:  Positive for agitation.   All other systems reviewed and are negative.       Objective    Vitals: BP 119/68 (BP Location: Left Arm, Patient Position: Sitting, Cuff Size: Normal)   Pulse (!) 59   Temp 98.4 F (36.9 C) (Temporal)   Resp 16   Ht 5\' 8"  (1.727 m)   Wt 170 lb (77.1 kg)   SpO2 96%   BMI 25.85 kg/m  BP Readings from Last 3 Encounters:  12/22/20 119/68  08/27/20 126/64  07/16/20 (!) 153/75   Wt Readings from Last 3 Encounters:  12/22/20 170 lb (77.1 kg)  08/27/20 172 lb 12.8 oz (78.4 kg)  07/15/20 169 lb (76.7 kg)      Physical Exam Vitals reviewed.  Constitutional:      Appearance: He is well-developed.  HENT:     Head: Normocephalic and atraumatic.     Right Ear: External ear normal.     Left Ear: External ear normal.     Nose: Nose normal.  Eyes:     General: No scleral icterus.    Conjunctiva/sclera: Conjunctivae normal.  Neck:     Thyroid: No thyromegaly.  Cardiovascular:     Rate and Rhythm: Normal rate and regular rhythm.     Heart sounds: Normal heart sounds.  Pulmonary:     Effort: Pulmonary effort is normal.     Breath sounds: Normal breath sounds.  Abdominal:     Palpations: Abdomen is soft.  Musculoskeletal:        General: Normal range of motion.  Skin:    General: Skin is warm and dry.     Comments: Multiple large seborrheic keratosis.  The ones around his neck appear to be very irritated and some are  even bleeding.  The skin on his trunk is also extremely dry.  Neurological:     General: No focal deficit present.     Mental Status: He is alert and  oriented to person, place, and time.  Psychiatric:        Mood and Affect: Mood normal.        Behavior: Behavior normal.        Thought Content: Thought content normal.        Judgment: Judgment normal.     Most recent functional status assessment: In your present state of health, do you have any difficulty performing the following activities: 12/22/2020  Hearing? Y  Vision? N  Difficulty concentrating or making decisions? Y  Walking or climbing stairs? N  Dressing or bathing? N  Doing errands, shopping? N  Some recent data might be hidden   Most recent fall risk assessment: Fall Risk  12/22/2020  Falls in the past year? 0  Number falls in past yr: 0  Injury with Fall? 0  Comment -  Risk for fall due to : No Fall Risks  Follow up Falls evaluation completed    Most recent depression screenings: PHQ 2/9 Scores 12/22/2020 04/07/2020  PHQ - 2 Score 0 0  PHQ- 9 Score 2 1   Most recent cognitive screening: No flowsheet data found. Most recent Audit-C alcohol use screening Alcohol Use Disorder Test (AUDIT) 12/22/2020  1. How often do you have a drink containing alcohol? 0  2. How many drinks containing alcohol do you have on a typical day when you are drinking? 0  3. How often do you have six or more drinks on one occasion? 0  AUDIT-C Score 0  Alcohol Brief Interventions/Follow-up -   A score of 3 or more in women, and 4 or more in men indicates increased risk for alcohol abuse, EXCEPT if all of the points are from question 1   No results found for any visits on 12/22/20.  Assessment & Plan     Annual wellness visit done today including the all of the following: Reviewed patient's Family Medical History Reviewed and updated list of patient's medical providers Assessment of cognitive impairment was done Assessed patient's functional ability Established a written schedule for health screening [place] Completed and Reviewed  Exercise Activities  and Dietary recommendations  Goals      Prevent falls     Recommend to remove any items from the home that may cause slips or trips.        Immunization History  Administered Date(s) Administered   Pneumococcal Conjugate-13 12/18/2017   Pneumococcal Polysaccharide-23 11/25/2019   Td 06/14/1999   Tdap 02/12/2010    Health Maintenance  Topic Date Due   COVID-19 Vaccine (1) Never done   Hepatitis C Screening  Never done   Zoster Vaccines- Shingrix (  1 of 2) Never done   TETANUS/TDAP  02/13/2020   INFLUENZA VACCINE  11/10/2021 (Originally 10/12/2020)   COLONOSCOPY (Pts 45-72yrs Insurance coverage will need to be confirmed)  07/28/2024   HPV VACCINES  Aged Out     Discussed health benefits of physical activity, and encouraged him to engage in regular exercise appropriate for his age and condition.    1. Medicare annual wellness visit, subsequent   2. Annual physical exam   3. Benign prostatic hyperplasia, unspecified whether lower urinary tract symptoms present At patient request will check PSA.  Consider finasteride if this persist - PSA   Return in about 6 months (around 06/22/2021).     I, Wilhemena Durie, MD, have reviewed all documentation for this visit. The documentation on 01/02/21 for the exam, diagnosis, procedures, and orders are all accurate and complete.    Tayt Moyers Cranford Mon, MD  [facility] [phone] (phone) [phone] (fax)  [place]

## 2020-12-23 LAB — PSA: Prostate Specific Ag, Serum: 4.7 ng/mL — ABNORMAL HIGH (ref 0.0–4.0)

## 2020-12-30 ENCOUNTER — Other Ambulatory Visit: Payer: Self-pay | Admitting: *Deleted

## 2020-12-30 DIAGNOSIS — R972 Elevated prostate specific antigen [PSA]: Secondary | ICD-10-CM

## 2020-12-30 DIAGNOSIS — N400 Enlarged prostate without lower urinary tract symptoms: Secondary | ICD-10-CM

## 2021-01-04 ENCOUNTER — Encounter: Payer: Self-pay | Admitting: *Deleted

## 2021-01-22 ENCOUNTER — Other Ambulatory Visit: Payer: Self-pay | Admitting: *Deleted

## 2021-01-22 ENCOUNTER — Telehealth: Payer: Self-pay | Admitting: Family Medicine

## 2021-01-22 DIAGNOSIS — R972 Elevated prostate specific antigen [PSA]: Secondary | ICD-10-CM

## 2021-01-22 DIAGNOSIS — N400 Enlarged prostate without lower urinary tract symptoms: Secondary | ICD-10-CM

## 2021-01-22 NOTE — Telephone Encounter (Signed)
Referral Request - Has patient seen PCP for this complaint? yes *If NO, is insurance requiring patient see PCP for this issue before PCP can refer them? Referral for which specialty: urollogist Preferred provider/office: N/A Reason for referral: psa is elevated

## 2021-01-22 NOTE — Telephone Encounter (Signed)
Patient would like referral to go to fDr reidlander [facility] [phone]

## 2021-01-22 NOTE — Telephone Encounter (Signed)
Please advise 

## 2021-01-22 NOTE — Telephone Encounter (Signed)
Referral ordered

## 2021-02-08 ENCOUNTER — Ambulatory Visit: Payer: Self-pay | Admitting: *Deleted

## 2021-02-08 NOTE — Telephone Encounter (Signed)
Per agent: "Patient tested positive for COVID and wanted to report to PCP. Patient currently experiencing coughing, congestion, patient states  Mucinex is working but would like to know COVID protocol regarding isolation. "  Attempted to reach pt, left VM to call back.

## 2021-02-09 NOTE — Telephone Encounter (Signed)
Attempted to return pt's call.  Left a voicemail to return the call to [facility].   He called in yesterday c/o being positive for Covid with coughing and congestion.   Mucinex is working but wanted to know the isolation protocol.

## 2021-02-09 NOTE — Telephone Encounter (Signed)
Third attempt to talk to pt. LM on VM to call back- phone number provided.- routing to [facility].

## 2021-02-09 NOTE — Telephone Encounter (Signed)
FYI

## 2021-02-12 ENCOUNTER — Telehealth: Payer: Self-pay

## 2021-02-12 NOTE — Telephone Encounter (Signed)
Copied from [place] [id number]. Topic: General - Other >> Feb 12, 2021  9:33 AM Tessa Lerner A wrote: Reason for CRM: The patient has called regarding referral  [phone]  The patient has been in contact with Dr. Sheral Apley office and been told that no referral has been submitted for them   The patient would like the referral resubmitted by fax when possible to [phone]  The patient would like for a member of practice staff to contact [place] at Dr. Sheral Apley office if needed   Please contact further when possible

## 2021-02-12 NOTE — Telephone Encounter (Signed)
Please advise 

## 2021-02-12 NOTE — Telephone Encounter (Signed)
Patient of Dr. Rosanna Randy, please review according to referral notes It says the following below  . Type Date User Summary Attachment  General 01/27/2021  2:58 PM Parke Poisson - -  Note   Referral sent to Dr Wilford Corner. Veronda Prude Phone: [phone] Fax [phone]         . Type Date User Summary Attachment  Provider Comments 01/22/2021  2:19 PM Sabra Heck, April M, [place] Provider Comments -  Note   Reason for Referral: BPH and elevated PSA   Has the referral been discussed with the patient?: yes   Designated contact for the referral if not the patient (name/phone number):    Has the patient seen a specialist for this issue before?: unknown  If so, who (practice/provider)?   Does the patient have a provider or location preference for the referral?: yes Would the patient like to see previous specialist if applicable? FRIEDLANDER, DAVID Arna Medici

## 2021-02-18 DIAGNOSIS — D2261 Melanocytic nevi of right upper limb, including shoulder: Secondary | ICD-10-CM | POA: Diagnosis not present

## 2021-02-18 DIAGNOSIS — D2262 Melanocytic nevi of left upper limb, including shoulder: Secondary | ICD-10-CM | POA: Diagnosis not present

## 2021-02-18 DIAGNOSIS — L820 Inflamed seborrheic keratosis: Secondary | ICD-10-CM | POA: Diagnosis not present

## 2021-02-18 DIAGNOSIS — L821 Other seborrheic keratosis: Secondary | ICD-10-CM | POA: Diagnosis not present

## 2021-02-18 DIAGNOSIS — R208 Other disturbances of skin sensation: Secondary | ICD-10-CM | POA: Diagnosis not present

## 2021-02-18 DIAGNOSIS — L538 Other specified erythematous conditions: Secondary | ICD-10-CM | POA: Diagnosis not present

## 2021-02-18 DIAGNOSIS — D225 Melanocytic nevi of trunk: Secondary | ICD-10-CM | POA: Diagnosis not present

## 2021-02-18 DIAGNOSIS — D2271 Melanocytic nevi of right lower limb, including hip: Secondary | ICD-10-CM | POA: Diagnosis not present

## 2021-02-18 DIAGNOSIS — D2272 Melanocytic nevi of left lower limb, including hip: Secondary | ICD-10-CM | POA: Diagnosis not present

## 2021-02-18 DIAGNOSIS — B078 Other viral warts: Secondary | ICD-10-CM | POA: Diagnosis not present

## 2021-04-14 DIAGNOSIS — R972 Elevated prostate specific antigen [PSA]: Secondary | ICD-10-CM | POA: Diagnosis not present

## 2021-04-14 DIAGNOSIS — R350 Frequency of micturition: Secondary | ICD-10-CM | POA: Diagnosis not present

## 2021-04-14 DIAGNOSIS — N5201 Erectile dysfunction due to arterial insufficiency: Secondary | ICD-10-CM | POA: Diagnosis not present

## 2021-04-14 DIAGNOSIS — N401 Enlarged prostate with lower urinary tract symptoms: Secondary | ICD-10-CM | POA: Diagnosis not present

## 2021-06-22 ENCOUNTER — Ambulatory Visit: Payer: Medicare HMO | Admitting: Family Medicine

## 2021-06-25 NOTE — Progress Notes (Signed)
?  ? ? ?Established patient visit ? ?I,April Miller,acting as a scribe for Wilhemena Durie, MD.,have documented all relevant documentation on the behalf of Wilhemena Durie, MD,as directed by  Wilhemena Durie, MD while in the presence of Wilhemena Durie, MD. ? ? ?Patient: Zachary Medina   DOB: [birth year]   71 y.o. Male  MRN: [id number] ?Visit Date: 06/28/2021 ? ?Today's healthcare provider: Wilhemena Durie, MD  ? ?Chief Complaint  ?Patient presents with  ? Follow-up  ? Hyperlipidemia  ? ?Subjective  ?  ?HPI  ?Patient comes in today for follow-up.  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  He does complain of left lateral foot pain with weightbearing. ?Lipid/Cholesterol, Follow-up ? ?Last lipid panel Other pertinent labs  ?Lab Results  ?Component Value Date  ? CHOL 189 07/16/2020  ? HDL 30 (L) 07/16/2020  ? LDLCALC 132 (H) 07/16/2020  ? TRIG 136 07/16/2020  ? CHOLHDL 6.3 07/16/2020  ? Lab Results  ?Component Value Date  ? ALT 22 07/15/2020  ? AST 21 07/15/2020  ? PLT 140 (L) 07/15/2020  ? TSH 2.052 07/15/2020  ?  ? ?He was last seen for this 7 months ago.  ?Management since that visit includes; not currently taking a medication. ? ?He reports good compliance with treatment. ?He is not having side effects. none ? ?Current diet: well balanced ?Current exercise: walking ? ?The 10-year ASCVD risk score (Arnett DK, et al., 2019) is: 27.8% ? ?--------------------------------------------------------------------------------------------------- ? ? ?Medications: ?Outpatient Medications Prior to Visit  ?Medication Sig  ? Ascorbic Acid (VITAMIN C PO) Take 1 tablet by mouth daily.  ? aspirin 81 MG tablet Take 81 mg by mouth daily.   ? Cyanocobalamin (VITAMIN B-12 PO) Take 1 tablet by mouth daily.  ? magnesium oxide (MAG-OX) 400 MG tablet Take 1 tablet (400 mg total) by mouth at bedtime.  ? nitroGLYCERIN (NITROSTAT) 0.4 MG SL tablet Place 1 tablet (0.4 mg total) under the tongue every 5 (five) minutes as needed for up to  25 doses for chest pain.  ? Omega 3 1000 MG CAPS Take 1,000 mg by mouth daily.  ? Vitamin D, Cholecalciferol, 1000 units TABS Take 2,000 Units by mouth daily.   ? [DISCONTINUED] atorvastatin (LIPITOR) 80 MG tablet Take 1 tablet (80 mg total) by mouth daily. (Patient not taking: Reported on 08/27/2020)  ? ?No facility-administered medications prior to visit.  ? ? ?Review of Systems  ?Constitutional:  Negative for appetite change, chills and fever.  ?Respiratory:  Negative for chest tightness, shortness of breath and wheezing.   ?Cardiovascular:  Negative for chest pain and palpitations.  ?Gastrointestinal:  Negative for abdominal pain, nausea and vomiting.  ? ?Last lipids ?Lab Results  ?Component Value Date  ? CHOL 189 07/16/2020  ? HDL 30 (L) 07/16/2020  ? LDLCALC 132 (H) 07/16/2020  ? TRIG 136 07/16/2020  ? CHOLHDL 6.3 07/16/2020  ? ?  ?  Objective  ?  ?BP 132/78 (BP Location: Right Arm, Patient Position: Sitting, Cuff Size: Normal)   Pulse (!) 59   Temp 98.3 ?F (36.8 ?C) (Temporal)   Resp 16   Ht '5\' 8"'$  (1.727 m)   Wt 169 lb (76.7 kg)   SpO2 98%   BMI 25.70 kg/m?  ?BP Readings from Last 3 Encounters:  ?06/28/21 132/78  ?12/22/20 119/68  ?08/27/20 126/64  ? ?Wt Readings from Last 3 Encounters:  ?06/28/21 169 lb (76.7 kg)  ?12/22/20 170 lb (77.1 kg)  ?08/27/20 172 lb 12.8  oz (78.4 kg)  ? ?  ? ?Physical Exam ?Vitals reviewed.  ?Constitutional:   ?   Appearance: He is well-developed.  ?HENT:  ?   Head: Normocephalic and atraumatic.  ?   Right Ear: External ear normal.  ?   Left Ear: External ear normal.  ?   Nose: Nose normal.  ?Eyes:  ?   General: No scleral icterus. ?   Conjunctiva/sclera: Conjunctivae normal.  ?Neck:  ?   Thyroid: No thyromegaly.  ?Cardiovascular:  ?   Rate and Rhythm: Normal rate and regular rhythm.  ?   Heart sounds: Normal heart sounds.  ?Pulmonary:  ?   Effort: Pulmonary effort is normal.  ?   Breath sounds: Normal breath sounds.  ?Abdominal:  ?   Palpations: Abdomen is soft.  ?Skin: ?    General: Skin is warm and dry.  ?   Comments: A small plantar wart on his left lateral foot.  ?Neurological:  ?   General: No focal deficit present.  ?   Mental Status: He is alert and oriented to person, place, and time.  ?Psychiatric:     ?   Mood and Affect: Mood normal.     ?   Behavior: Behavior normal.     ?   Thought Content: Thought content normal.     ?   Judgment: Judgment normal.  ?  ? ? ?No results found for any visits on 06/28/21. ? Assessment & Plan  ?  ? ?1. Essential hypertension ?Very good control. ?- Lipid panel ?- TSH ?- CBC w/Diff/Platelet ?- Comprehensive Metabolic Panel (CMET) ?- Hemoglobin A1c ? ?2. Hyperlipidemia, unspecified hyperlipidemia type ?On high-dose atorvastatin. ?- Lipid panel ?- TSH ?- CBC w/Diff/Platelet ?- Comprehensive Metabolic Panel (CMET) ?- Hemoglobin A1c ? ?3. Prediabetes ?Continue to work on lifestyle ?- Lipid panel ?- TSH ?- CBC w/Diff/Platelet ?- Comprehensive Metabolic Panel (CMET) ?- Hemoglobin A1c ? ?4. Benign prostatic hyperplasia, unspecified whether lower urinary tract symptoms present ? ?- Lipid panel ?- TSH ?- CBC w/Diff/Platelet ?- Comprehensive Metabolic Panel (CMET) ?- Hemoglobin A1c ? ?5. Obstructive apnea ? ?- Lipid panel ?- TSH ?- CBC w/Diff/Platelet ?- Comprehensive Metabolic Panel (CMET) ?- Hemoglobin A1c ? ?6. H/O transient cerebral ischemia ?Risk factors treated. ?- Lipid panel ?- TSH ?- CBC w/Diff/Platelet ?- Comprehensive Metabolic Panel (CMET) ?- Hemoglobin A1c ? ?7. Plantar wart ?Patient has tried over-the-counter remedies without result. ?- Ambulatory referral to Podiatry ? ? ?No follow-ups on file.  ?   ? ?I, Wilhemena Durie, MD, have reviewed all documentation for this visit. The documentation on 07/03/21 for the exam, diagnosis, procedures, and orders are all accurate and complete. ? ? ? ?Alysabeth Scalia Cranford Mon, MD  ?[facility] ?[phone] (phone) ?[phone] (fax) ? ?[facility] ?

## 2021-06-28 ENCOUNTER — Encounter: Payer: Self-pay | Admitting: Family Medicine

## 2021-06-28 ENCOUNTER — Ambulatory Visit (INDEPENDENT_AMBULATORY_CARE_PROVIDER_SITE_OTHER): Payer: Medicare HMO | Admitting: Family Medicine

## 2021-06-28 VITALS — BP 132/78 | HR 59 | Temp 98.3°F | Resp 16 | Ht 68.0 in | Wt 169.0 lb

## 2021-06-28 DIAGNOSIS — Z8673 Personal history of transient ischemic attack (TIA), and cerebral infarction without residual deficits: Secondary | ICD-10-CM | POA: Diagnosis not present

## 2021-06-28 DIAGNOSIS — B070 Plantar wart: Secondary | ICD-10-CM

## 2021-06-28 DIAGNOSIS — G4733 Obstructive sleep apnea (adult) (pediatric): Secondary | ICD-10-CM

## 2021-06-28 DIAGNOSIS — I10 Essential (primary) hypertension: Secondary | ICD-10-CM | POA: Diagnosis not present

## 2021-06-28 DIAGNOSIS — N400 Enlarged prostate without lower urinary tract symptoms: Secondary | ICD-10-CM

## 2021-06-28 DIAGNOSIS — E785 Hyperlipidemia, unspecified: Secondary | ICD-10-CM | POA: Diagnosis not present

## 2021-06-28 DIAGNOSIS — R7303 Prediabetes: Secondary | ICD-10-CM

## 2021-07-08 ENCOUNTER — Other Ambulatory Visit: Payer: Self-pay

## 2021-07-08 DIAGNOSIS — R7303 Prediabetes: Secondary | ICD-10-CM | POA: Diagnosis not present

## 2021-07-08 DIAGNOSIS — E785 Hyperlipidemia, unspecified: Secondary | ICD-10-CM | POA: Diagnosis not present

## 2021-07-08 DIAGNOSIS — N400 Enlarged prostate without lower urinary tract symptoms: Secondary | ICD-10-CM

## 2021-07-08 DIAGNOSIS — Z8673 Personal history of transient ischemic attack (TIA), and cerebral infarction without residual deficits: Secondary | ICD-10-CM | POA: Diagnosis not present

## 2021-07-08 DIAGNOSIS — I10 Essential (primary) hypertension: Secondary | ICD-10-CM | POA: Diagnosis not present

## 2021-07-08 DIAGNOSIS — G4733 Obstructive sleep apnea (adult) (pediatric): Secondary | ICD-10-CM | POA: Diagnosis not present

## 2021-07-09 LAB — TSH: TSH: 2.11 u[IU]/mL (ref 0.450–4.500)

## 2021-07-09 LAB — LIPID PANEL
Chol/HDL Ratio: 5.4 ratio — ABNORMAL HIGH (ref 0.0–5.0)
Cholesterol, Total: 184 mg/dL (ref 100–199)
HDL: 34 mg/dL — ABNORMAL LOW (ref >39)
LDL Chol Calc (NIH): 125 mg/dL — ABNORMAL HIGH (ref 0–99)
Triglycerides: 141 mg/dL (ref 0–149)
VLDL Cholesterol Cal: 25 mg/dL (ref 5–40)

## 2021-07-09 LAB — CBC WITH DIFFERENTIAL/PLATELET
Basophils Absolute: 0 10*3/uL (ref 0.0–0.2)
Basos: 1 %
EOS (ABSOLUTE): 0.2 10*3/uL (ref 0.0–0.4)
Eos: 4 %
Hematocrit: 47.9 % (ref 37.5–51.0)
Hemoglobin: 16.5 g/dL (ref 13.0–17.7)
Immature Grans (Abs): 0 10*3/uL (ref 0.0–0.1)
Immature Granulocytes: 0 %
Lymphocytes Absolute: 1 10*3/uL (ref 0.7–3.1)
Lymphs: 26 %
MCH: 31.3 pg (ref 26.6–33.0)
MCHC: 34.4 g/dL (ref 31.5–35.7)
MCV: 91 fL (ref 79–97)
Monocytes Absolute: 0.5 10*3/uL (ref 0.1–0.9)
Monocytes: 12 %
Neutrophils Absolute: 2.2 10*3/uL (ref 1.4–7.0)
Neutrophils: 57 %
Platelets: 153 10*3/uL (ref 150–450)
RBC: 5.28 x10E6/uL (ref 4.14–5.80)
RDW: 12.9 % (ref 11.6–15.4)
WBC: 3.8 10*3/uL (ref 3.4–10.8)

## 2021-07-09 LAB — COMPREHENSIVE METABOLIC PANEL
ALT: 17 IU/L (ref 0–44)
AST: 19 IU/L (ref 0–40)
Albumin/Globulin Ratio: 1.8 (ref 1.2–2.2)
Albumin: 4.3 g/dL (ref 3.7–4.7)
Alkaline Phosphatase: 67 IU/L (ref 44–121)
BUN/Creatinine Ratio: 16 (ref 10–24)
BUN: 18 mg/dL (ref 8–27)
Bilirubin Total: 1.7 mg/dL — ABNORMAL HIGH (ref 0.0–1.2)
CO2: 26 mmol/L (ref 20–29)
Calcium: 9.4 mg/dL (ref 8.6–10.2)
Chloride: 103 mmol/L (ref 96–106)
Creatinine, Ser: 1.12 mg/dL (ref 0.76–1.27)
Globulin, Total: 2.4 g/dL (ref 1.5–4.5)
Glucose: 105 mg/dL — ABNORMAL HIGH (ref 70–99)
Potassium: 4.1 mmol/L (ref 3.5–5.2)
Sodium: 142 mmol/L (ref 134–144)
Total Protein: 6.7 g/dL (ref 6.0–8.5)
eGFR: 70 mL/min/{1.73_m2} (ref >59)

## 2021-07-09 LAB — HEMOGLOBIN A1C
Est. average glucose Bld gHb Est-mCnc: 126 mg/dL
Hgb A1c MFr Bld: 6 % — ABNORMAL HIGH (ref 4.8–5.6)

## 2021-07-09 LAB — PSA: Prostate Specific Ag, Serum: 4 ng/mL (ref 0.0–4.0)

## 2021-07-22 DIAGNOSIS — M778 Other enthesopathies, not elsewhere classified: Secondary | ICD-10-CM | POA: Diagnosis not present

## 2021-07-22 DIAGNOSIS — R7303 Prediabetes: Secondary | ICD-10-CM | POA: Diagnosis not present

## 2021-09-10 ENCOUNTER — Telehealth: Payer: Self-pay

## 2021-09-10 NOTE — Telephone Encounter (Signed)
Copied from [place] [id number]. Topic: Medical Record Request - Other >> Sep 10, 2021  9:48 AM Zachary Medina wrote: Reason for CRM: Gerli with [employer] has called to check the status of a previously submitted request for records   Zachary Medina shares that the original request was faxed on 08/25/21  Work order # [id number]  Please contact further when possible to provide update

## 2021-10-01 ENCOUNTER — Other Ambulatory Visit: Payer: Self-pay | Admitting: *Deleted

## 2021-10-01 DIAGNOSIS — G4733 Obstructive sleep apnea (adult) (pediatric): Secondary | ICD-10-CM

## 2022-01-03 ENCOUNTER — Encounter: Payer: Medicare HMO | Admitting: Family Medicine

## 2022-01-27 DIAGNOSIS — H2513 Age-related nuclear cataract, bilateral: Secondary | ICD-10-CM | POA: Diagnosis not present

## 2022-01-27 DIAGNOSIS — H0288A Meibomian gland dysfunction right eye, upper and lower eyelids: Secondary | ICD-10-CM | POA: Diagnosis not present

## 2022-01-27 DIAGNOSIS — H43393 Other vitreous opacities, bilateral: Secondary | ICD-10-CM | POA: Diagnosis not present

## 2022-01-27 DIAGNOSIS — H0288B Meibomian gland dysfunction left eye, upper and lower eyelids: Secondary | ICD-10-CM | POA: Diagnosis not present

## 2022-02-21 DIAGNOSIS — L821 Other seborrheic keratosis: Secondary | ICD-10-CM | POA: Diagnosis not present

## 2022-02-21 DIAGNOSIS — Z872 Personal history of diseases of the skin and subcutaneous tissue: Secondary | ICD-10-CM | POA: Diagnosis not present

## 2022-02-21 DIAGNOSIS — Z09 Encounter for follow-up examination after completed treatment for conditions other than malignant neoplasm: Secondary | ICD-10-CM | POA: Diagnosis not present

## 2022-02-21 DIAGNOSIS — D2262 Melanocytic nevi of left upper limb, including shoulder: Secondary | ICD-10-CM | POA: Diagnosis not present

## 2022-02-21 DIAGNOSIS — D2272 Melanocytic nevi of left lower limb, including hip: Secondary | ICD-10-CM | POA: Diagnosis not present

## 2022-02-21 DIAGNOSIS — D2261 Melanocytic nevi of right upper limb, including shoulder: Secondary | ICD-10-CM | POA: Diagnosis not present

## 2022-02-21 DIAGNOSIS — D2271 Melanocytic nevi of right lower limb, including hip: Secondary | ICD-10-CM | POA: Diagnosis not present

## 2022-02-21 DIAGNOSIS — D225 Melanocytic nevi of trunk: Secondary | ICD-10-CM | POA: Diagnosis not present

## 2022-02-21 DIAGNOSIS — Q801 X-linked ichthyosis: Secondary | ICD-10-CM | POA: Diagnosis not present

## 2022-04-19 ENCOUNTER — Ambulatory Visit: Payer: Medicare HMO | Attending: Physician Assistant | Admitting: Physician Assistant

## 2022-04-19 ENCOUNTER — Ambulatory Visit (INDEPENDENT_AMBULATORY_CARE_PROVIDER_SITE_OTHER): Payer: Medicare HMO

## 2022-04-19 ENCOUNTER — Encounter: Payer: Self-pay | Admitting: Physician Assistant

## 2022-04-19 VITALS — BP 136/72 | HR 60 | Ht 68.0 in | Wt 168.0 lb

## 2022-04-19 DIAGNOSIS — I493 Ventricular premature depolarization: Secondary | ICD-10-CM

## 2022-04-19 DIAGNOSIS — G459 Transient cerebral ischemic attack, unspecified: Secondary | ICD-10-CM

## 2022-04-19 DIAGNOSIS — R002 Palpitations: Secondary | ICD-10-CM | POA: Diagnosis not present

## 2022-04-19 DIAGNOSIS — I471 Supraventricular tachycardia, unspecified: Secondary | ICD-10-CM | POA: Diagnosis not present

## 2022-04-19 MED ORDER — NITROGLYCERIN 0.4 MG SL SUBL: 0.4000 mg | SUBLINGUAL_TABLET | SUBLINGUAL | 4 refills | Status: AC | PRN

## 2022-04-19 NOTE — Progress Notes (Signed)
Cardiology Office Note    Date:  04/19/2022   ID:  Jerritt, Cardoza 01-Feb-1951, MRN [id number]  PCP:  Eulas Post, MD  Cardiologist:  Ida Rogue, MD  Electrophysiologist:  None   Chief Complaint: Palpitations   History of Present Illness:   Zachary Medina is a 72 y.o. male with history of palpitations, PSVT, PVCs, TIA, HLD, nephrolithiasis, OSA on CPAP, and GERD who presents for evaluation of palpitations.  He was evaluated in 06/2017 with palpitations with subsequent Zio patch in showing a predominant rhythm of sinus with an average rate of 64 bpm with 1 run of NSVT lasting 4 beats, 66 episodes of SVT lasting up to 32.3 seconds, and occasional isolated PVCs with an overall burden of 3.7%.  He was seen in 07/2019 with chest pain with subsequent coronary CTA in 08/2019 showing a calcium score 0 with no evidence of CAD.  He was admitted to the hospital in 07/2020 with transient right hand and face numbness and weakness felt to be possibly related to a TIA.  During the admission he was bradycardic with PVCs and was having what appeared to be post compensatory pauses.  He was minimally symptomatic.  Echo in 07/2020 showed an EF of 55 to 60%, no regional wall motion abnormalities, grade 1 diastolic dysfunction, normal RV systolic function and ventricular cavity size, normal PASP, trivial aortic insufficiency, and an estimated right atrial pressure of 3 mmHg.  He was evaluated by EP in 08/2020, at the request of PCP for bradycardia and PVCs as noted above.  At that time, he was without symptoms of angina or decompensation.  He remains active at baseline.  There was no indication for further intervention.  He comes in doing well from a cardiac perspective and is without symptoms of angina or decompensation.  He remains active at baseline, walking at least 30 minutes/day without cardiac limitation.  However, dating back to last month, he has had several episodes of palpitations described as a  "pounding" sensation that radiates up into his throat and is associated with lightheadedness without frank syncope.  No angina or dyspnea associated with these episodes.  These episodes have lasted for approximately 15 minutes in duration each with spontaneous resolution.  He last had an episode 2 days ago.  These episodes have occurred in the context of eating.  Currently asymptomatic.   Labs independently reviewed: 06/2021 - A1c 6.0, BUN 18, serum creatinine 1.12, potassium 4.1, albumin 4.3, AST/ALT normal, Hgb 16.5, PLT 153, TSH normal, TC 184, TG 141, HDL 34, LDL 125  Past Medical History:  Diagnosis Date   GERD (gastroesophageal reflux disease)    Hepatitis    Hyperlipidemia    Kidney stones    TIA (transient ischemic attack)     Past Surgical History:  Procedure Laterality Date   CHOLECYSTECTOMY     COLONOSCOPY N/A 07/29/2014   Procedure: COLONOSCOPY;  Surgeon: Lucilla Lame, MD;  Location: [facility] ENDOSCOPY;  Service: Endoscopy;  Laterality: N/A;   HERNIA REPAIR      Current Medications: Current Meds  Medication Sig   Ascorbic Acid (VITAMIN C PO) Take 1 tablet by mouth daily.   aspirin 81 MG tablet Take 81 mg by mouth daily.    Cyanocobalamin (VITAMIN B-12 PO) Take 1 tablet by mouth daily.   magnesium oxide (MAG-OX) 400 MG tablet Take 1 tablet (400 mg total) by mouth at bedtime.   Omega 3 1000 MG CAPS Take 1,000 mg by mouth daily.   Vitamin  D, Cholecalciferol, 1000 units TABS Take 2,000 Units by mouth daily.    [DISCONTINUED] nitroGLYCERIN (NITROSTAT) 0.4 MG SL tablet Place 1 tablet (0.4 mg total) under the tongue every 5 (five) minutes as needed for up to 25 doses for chest pain.    Allergies:   Patient has no known allergies.   Social History   Socioeconomic History   Marital status: Married    Spouse name: Not on file   Number of children: 3   Years of education: Not on file   Highest education level: Master's degree (e.g., MA, MS, MEng, MEd, MSW, MBA)  Occupational  History   Not on file  Tobacco Use   Smoking status: Never   Smokeless tobacco: Never  Vaping Use   Vaping Use: Never used  Substance and Sexual Activity   Alcohol use: No   Drug use: No   Sexual activity: Not on file  Other Topics Concern   Not on file  Social History Narrative   Not on file   Social Determinants of Health   Financial Resource Strain: Low Risk  (12/10/2019)   Overall Financial Resource Strain (CARDIA)    Difficulty of Paying Living Expenses: Not hard at all  Food Insecurity: No Food Insecurity (12/10/2019)   Hunger Vital Sign    Worried About Running Out of Food in the Last Year: Never true    Ran Out of Food in the Last Year: Never true  Transportation Needs: No Transportation Needs (12/10/2019)   PRAPARE - Hydrologist (Medical): No    Lack of Transportation (Non-Medical): No  Physical Activity: Insufficiently Active (12/10/2019)   Exercise Vital Sign    Days of Exercise per Week: 4 days    Minutes of Exercise per Session: 30 min  Stress: No Stress Concern Present (12/10/2019)   [place]    Feeling of Stress : Only a little  Social Connections: Socially Integrated (12/10/2019)   Social Connection and Isolation Panel [NHANES]    Frequency of Communication with Friends and Family: More than three times a week    Frequency of Social Gatherings with Friends and Family: More than three times a week    Attends Religious Services: More than 4 times per year    Active Member of [employer] or Organizations: Yes    Attends Music therapist: More than 4 times per year    Marital Status: Married     Family History:  The patient's family history includes Arthritis in his father and mother; Congestive Heart Failure in his father; Diabetes in his brother; Glaucoma in his mother; Heart disease in his father; Parkinson's disease in his father; Prostate cancer in his  father.  ROS:   12-point review of systems is negative unless otherwise noted in the HPI.   EKGs/Labs/Other Studies Reviewed:    Studies reviewed were summarized above. The additional studies were reviewed today:  2D echo 07/16/2020: 1. Left ventricular ejection fraction, by estimation, is 55 to 60%. The  left ventricle has normal function. The left ventricle has no regional  wall motion abnormalities. Left ventricular diastolic parameters are  consistent with Grade I diastolic  dysfunction (impaired relaxation).   2. Right ventricular systolic function is normal. The right ventricular  size is normal. There is normal pulmonary artery systolic pressure.   3. The mitral valve is normal in structure. No evidence of mitral valve  regurgitation. No evidence of mitral stenosis.  4. The aortic valve is tricuspid. Aortic valve regurgitation is trivial.   5. The inferior vena cava is normal in size with greater than 50%  respiratory variability, suggesting right atrial pressure of 3 mmHg.  __________  Coronary CTA 08/22/2019: Aorta:  Normal size.  No calcifications.  No dissection.   Aortic Valve:  Trileaflet.  No calcifications.   Coronary Arteries:  Normal coronary origin.  Right dominance.   RCA is a large dominant artery that gives rise to PDA and PLA. There is no plaque.   Left main is a large artery that gives rise to LAD and LCX arteries.   LAD is a large vessel that has no plaque.   LCX is a non-dominant artery that gives rise to two obtuse marginal branches. There is no plaque.   Other findings:   Normal pulmonary vein drainage into the left atrium.   Normal left atrial appendage without a thrombus.   Normal size of the pulmonary artery.   IMPRESSION: 1. Coronary calcium score of 0. Patient is low risk for near term coronary events 2. Normal coronary origin with right dominance. 3. No evidence of CAD. __________  Elwyn Reach patch 06/2017: Normal sinus rhythm   Avg  HR of 64 bpm.   1 run of Ventricular Tachycardia occurred lasting 4 beats with a max rate of 250 bpm (avg 203 bpm).    66 Supraventricular Tachycardia/atrial tachycardia runs occurred,  run with the fastest interval lasting 7 beats with a max rate of 190 bpm,  longest lasting 32.3 secs with an avg rate of 97 bpm.    Idioventricular Rhythm was present.    Isolated SVEs were rare (<1.0%), SVE Couplets were rare (<1.0%), and SVE Triplets were rare (<1.0%). Isolated VEs were occasional (3.7%, 35352), VE Couplets were rare (<1.0%, 766),  and VE Triplets were rare (<1.0%, 4). Ventricular Bigeminy and Trigeminy were present.   EKG:  EKG with rhythm strip is ordered today.  The EKG ordered today demonstrates NSR, 60 bpm, occasional PVCs with rare PAC  Recent Labs: 07/08/2021: ALT 17; BUN 18; Creatinine, Ser 1.12; Hemoglobin 16.5; Platelets 153; Potassium 4.1; Sodium 142; TSH 2.110  Recent Lipid Panel    Component Value Date/Time   CHOL 184 07/08/2021 0929   TRIG 141 07/08/2021 0929   HDL 34 (L) 07/08/2021 0929   CHOLHDL 5.4 (H) 07/08/2021 0929   CHOLHDL 6.3 07/16/2020 0224   VLDL 27 07/16/2020 0224   LDLCALC 125 (H) 07/08/2021 0929    PHYSICAL EXAM:    VS:  BP 136/72 (BP Location: Left Arm, Patient Position: Sitting, Cuff Size: Normal)   Pulse 60   Ht '5\' 8"'$  (1.727 m)   Wt 168 lb (76.2 kg)   SpO2 98%   BMI 25.54 kg/m   BMI: Body mass index is 25.54 kg/m.  Physical Exam Vitals reviewed.  Constitutional:      Appearance: He is well-developed.  HENT:     Head: Normocephalic and atraumatic.  Eyes:     General:        Right eye: No discharge.        Left eye: No discharge.  Neck:     Vascular: No JVD.  Cardiovascular:     Rate and Rhythm: Normal rate and regular rhythm. Occasional Extrasystoles are present.    Heart sounds: Normal heart sounds, S1 normal and S2 normal. Heart sounds not distant. No midsystolic click and no opening snap. No murmur heard.    No friction rub.  Pulmonary:     Effort: Pulmonary effort is normal. No respiratory distress.     Breath sounds: Normal breath sounds. No decreased breath sounds, wheezing or rales.  Chest:     Chest wall: No tenderness.  Abdominal:     General: There is no distension.  Musculoskeletal:     Cervical back: Normal range of motion.     Right lower leg: No edema.     Left lower leg: No edema.  Skin:    General: Skin is warm and dry.     Nails: There is no clubbing.  Neurological:     Mental Status: He is alert and oriented to person, place, and time.  Psychiatric:        Speech: Speech normal.        Behavior: Behavior normal.        Thought Content: Thought content normal.        Judgment: Judgment normal.     Wt Readings from Last 3 Encounters:  04/19/22 168 lb (76.2 kg)  06/28/21 169 lb (76.7 kg)  12/22/20 170 lb (77.1 kg)     ASSESSMENT & PLAN:   Palpitations with associated lightheadedness/PSVT/PVCs: Prior outpatient cardiac monitoring showed a rhythm of sinus with an average rate of 64 bpm with 66 episodes of SVT with the longest interval lasting 32.3 seconds with occasional isolated PVCs with an overall burden of 3.7%.  His heart rate has historically ran in the 50s to 60s bpm at baseline.  He has been asymptomatic with this.  More recently, he has noted several episodes of palpitations with associated lightheadedness that will last for approximately 15 minutes.  Place ZIO XT and obtain echo.  Further recommendations pending.  HLD: LDL 125 in 06/2021.  Calcium score of 0 on prior coronary CTA.  History of TIA: No new deficits.  He remains on aspirin.   Disposition: F/u with Dr. Rockey Situ or an APP after testing.   Medication Adjustments/Labs and Tests Ordered: Current medicines are reviewed at length with the patient today.  Concerns regarding medicines are outlined above. Medication changes, Labs and Tests ordered today are summarized above and listed in the Patient Instructions accessible  in Encounters.   SignedChristell Faith, PA-C 04/19/2022 11:52 AM     [place] [address] [place] [address] [place], [place] [address] [phone]

## 2022-04-19 NOTE — Patient Instructions (Signed)
Medication Instructions:  No changes at this time.   *If you need a refill on your cardiac medications before your next appointment, please call your pharmacy*   Lab Work: None  If you have labs (blood work) drawn today and your tests are completely normal, you will receive your results only by: [place] (if you have MyChart) OR A paper copy in the mail If you have any lab test that is abnormal or we need to change your treatment, we will call you to review the results.   Testing/Procedures: Your physician has requested that you have an echocardiogram. Echocardiography is a painless test that uses sound waves to create images of your heart. It provides your doctor with information about the size and shape of your heart and how well your heart's chambers and valves are working. This procedure takes approximately one hour. There are no restrictions for this procedure. Please do NOT wear cologne, perfume, aftershave, or lotions (deodorant is allowed). Please arrive 15 minutes prior to your appointment time.  Your physician has recommended that you wear a Zio monitor for 2 weeks.   This monitor is a medical device that records the heart's electrical activity. Doctors most often use these monitors to diagnose arrhythmias. Arrhythmias are problems with the speed or rhythm of the heartbeat. The monitor is a small device applied to your chest. You can wear one while you do your normal daily activities. While wearing this monitor if you have any symptoms to push the button and record what you felt. Once you have worn this monitor for the period of time provider prescribed (Usually 14 days), you will return the monitor device in the postage paid box. Once it is returned they will download the data collected and provide [place] with a report which the provider will then review and we will call you with those results. Important tips:  Avoid showering during the first 24 hours of wearing the  monitor. Avoid excessive sweating to help maximize wear time. Do not submerge the device, no hot tubs, and no swimming pools. Keep any lotions or oils away from the patch. After 24 hours you may shower with the patch on. Take brief showers with your back facing the shower head.  Do not remove patch once it has been placed because that will interrupt data and decrease adhesive wear time. Push the button when you have any symptoms and write down what you were feeling. Once you have completed wearing your monitor, remove and place into box which has postage paid and place in your outgoing mailbox.  If for some reason you have misplaced your box then call our office and we can provide another box and/or mail it off for you.   Follow-Up: At [facility], you and your health needs are our priority.  As part of our continuing mission to provide you with exceptional heart care, we have created designated Provider Care Teams.  These Care Teams include your primary Cardiologist (physician) and Advanced Practice Providers (APPs -  Physician Assistants and Nurse Practitioners) who all work together to provide you with the care you need, when you need it.   Your next appointment:   Follow up after testing.   Provider:   Ida Rogue, MD or Christell Faith, PA-C

## 2022-04-21 DIAGNOSIS — E7801 Familial hypercholesterolemia: Secondary | ICD-10-CM | POA: Diagnosis not present

## 2022-04-21 DIAGNOSIS — R69 Illness, unspecified: Secondary | ICD-10-CM | POA: Diagnosis not present

## 2022-04-21 DIAGNOSIS — R413 Other amnesia: Secondary | ICD-10-CM | POA: Diagnosis not present

## 2022-04-21 DIAGNOSIS — R002 Palpitations: Secondary | ICD-10-CM | POA: Diagnosis not present

## 2022-04-21 DIAGNOSIS — I499 Cardiac arrhythmia, unspecified: Secondary | ICD-10-CM | POA: Diagnosis not present

## 2022-04-21 DIAGNOSIS — E7800 Pure hypercholesterolemia, unspecified: Secondary | ICD-10-CM | POA: Diagnosis not present

## 2022-04-24 DIAGNOSIS — I493 Ventricular premature depolarization: Secondary | ICD-10-CM

## 2022-04-24 DIAGNOSIS — G459 Transient cerebral ischemic attack, unspecified: Secondary | ICD-10-CM

## 2022-04-26 NOTE — Addendum Note (Signed)
Addended by: James Ivanoff D on: 04/26/2022 08:30 AM   Modules accepted: Orders

## 2022-05-19 ENCOUNTER — Telehealth: Payer: Self-pay | Admitting: Physician Assistant

## 2022-05-19 DIAGNOSIS — I493 Ventricular premature depolarization: Secondary | ICD-10-CM | POA: Diagnosis not present

## 2022-05-19 NOTE — Telephone Encounter (Signed)
Zachary Medina calling to give abnormal result

## 2022-05-19 NOTE — Telephone Encounter (Signed)
Called and spoke with patient. Discussed that his monitor results have come in and they would like to see him next week. Scheduled date & time and he repeated appointment information back. He was agreeable with plan, verbalized understanding, with no further questions at this time.

## 2022-05-19 NOTE — Telephone Encounter (Signed)
Left voicemail message to call back  

## 2022-05-19 NOTE — Telephone Encounter (Signed)
Pt returning nurse's call. Please advise

## 2022-05-19 NOTE — Telephone Encounter (Signed)
Reviewed with Dr. Caryl Comes regarding [employer] alert. He reviewed report with no changes at this time.   Reviewed with DOD Dr. Fletcher Anon because it was noted that he has Afib on the monitor which is new for this patient. Patient is not on any anticoagulation. Recommendations were to schedule him for appointment in the next few days.

## 2022-05-19 NOTE — Telephone Encounter (Signed)
Zachary Medina calling from [employer]. She is calling to report abnormal findings.   Symptomatic bradycardia with HR 37 lasting for 30 seconds.   Report has been posted and strip can be found on page 19 Strip #12

## 2022-05-23 DIAGNOSIS — H903 Sensorineural hearing loss, bilateral: Secondary | ICD-10-CM | POA: Diagnosis not present

## 2022-05-23 DIAGNOSIS — H6123 Impacted cerumen, bilateral: Secondary | ICD-10-CM | POA: Diagnosis not present

## 2022-05-25 NOTE — Progress Notes (Unsigned)
Cardiology Office Note    Date:  05/26/2022   ID:  Zachary Medina, DOB 1951/03/11, MRN [id number]  PCP:  Eulas Post, MD  Cardiologist:  Ida Rogue, MD  Electrophysiologist:  None   Chief Complaint: Follow-up  History of Present Illness:   Zachary Medina is a 72 y.o. male with history of palpitations, PSVT, PVCs, TIA, HLD, nephrolithiasis, OSA not on CPAP, and GERD who presents for follow-up of Zio patch.   He was evaluated in 06/2017 with palpitations with subsequent Zio patch in showing a predominant rhythm of sinus with an average rate of 64 bpm with 1 run of NSVT lasting 4 beats, 66 episodes of SVT lasting up to 32.3 seconds, and occasional isolated PVCs with an overall burden of 3.7%.  He was seen in 07/2019 with chest pain with subsequent coronary CTA in 08/2019 showing a calcium score 0 with no evidence of CAD.   He was admitted to the hospital in 07/2020 with transient right hand and face numbness and weakness felt to be possibly related to a TIA.  During the admission he was bradycardic with PVCs and was having what appeared to be post compensatory pauses.  He was minimally symptomatic.  Echo in 07/2020 showed an EF of 55 to 60%, no regional wall motion abnormalities, grade 1 diastolic dysfunction, normal RV systolic function and ventricular cavity size, normal PASP, trivial aortic insufficiency, and an estimated right atrial pressure of 3 mmHg.   He was evaluated by EP in 08/2020, at the request of PCP for bradycardia and PVCs as noted above.  At that time, he was without symptoms of angina or decompensation.  He remained active at baseline.  There was no indication for further intervention.  He was seen in the office in 04/2022 noting several episodes of palpitations described as a "pounding" that radiated up into his throat and was associated with lightheadedness without frank syncope.  He was without symptoms of angina and remained active at baseline.  Echo was ordered and  remains pending.  Zio patch demonstrated a predominant rhythm of sinus with an average rate of 56 bpm (range 32 to 176 bpm) 1 run of NSVT lasting 7 beats, 98 episodes of SVT with the fastest interval lasting less than 6 beats with a max rate of 176 bpm and the longest interval lasting 19.9 seconds with an average rate of 111 bpm.  A-fib occurred with a less than 1% burden with ventricular rates ranging from 73 to 161 bpm with an average ventricular rate of 110 bpm.  The longest episode of A-fib lasted 1 hour and 37 minutes.  A-fib was detected with symptomatic patient events.  PVCs were occasional with a 2.1% burden.  P wave morphology changes noted.  Nocturnal bradycardia in the 30s bpm was noted.  Given new onset A-fib being identified, appointment was made for today.  He comes in today and is without symptoms of angina or cardiac decompensation.  No symptoms concerning for neurological deficits.  No recent falls or bleeding concerns.  He reports a long history of bradycardic heart rates and has been asymptomatic with this.  He does have underlying sleep apnea, not currently using CPAP secondary to prior recall.   Labs independently reviewed: 04/2022 - Hgb 16.0, PLT 135, potassium 4.5, BUN 21, serum creatinine 1.1, albumin 4.5, AST/ALT normal, TC 173, TG 120, HDL 34, LDL 114, TSH normal 06/2021 - A1c 6.0  Past Medical History:  Diagnosis Date   GERD (gastroesophageal reflux disease)  Hepatitis    Hyperlipidemia    Kidney stones    TIA (transient ischemic attack)     Past Surgical History:  Procedure Laterality Date   CHOLECYSTECTOMY     COLONOSCOPY N/A 07/29/2014   Procedure: COLONOSCOPY;  Surgeon: Lucilla Lame, MD;  Location: [facility] ENDOSCOPY;  Service: Endoscopy;  Laterality: N/A;   HERNIA REPAIR      Current Medications: Current Meds  Medication Sig   Ascorbic Acid (VITAMIN C PO) Take 1 tablet by mouth daily.   aspirin 81 MG tablet Take 81 mg by mouth daily.    Cyanocobalamin (VITAMIN  B-12 PO) Take 1 tablet by mouth daily.   magnesium oxide (MAG-OX) 400 MG tablet Take 1 tablet (400 mg total) by mouth at bedtime.   nitroGLYCERIN (NITROSTAT) 0.4 MG SL tablet Place 1 tablet (0.4 mg total) under the tongue every 5 (five) minutes as needed for chest pain.   Omega 3 1000 MG CAPS Take 1,000 mg by mouth daily.   Vitamin D, Cholecalciferol, 1000 units TABS Take 2,000 Units by mouth daily.     Allergies:   Patient has no known allergies.   Social History   Socioeconomic History   Marital status: Married    Spouse name: Not on file   Number of children: 3   Years of education: Not on file   Highest education level: Master's degree (e.g., MA, MS, MEng, MEd, MSW, MBA)  Occupational History   Not on file  Tobacco Use   Smoking status: Never   Smokeless tobacco: Never  Vaping Use   Vaping Use: Never used  Substance and Sexual Activity   Alcohol use: No   Drug use: No   Sexual activity: Not on file  Other Topics Concern   Not on file  Social History Narrative   Not on file   Social Determinants of Health   Financial Resource Strain: Low Risk  (12/10/2019)   Overall Financial Resource Strain (CARDIA)    Difficulty of Paying Living Expenses: Not hard at all  Food Insecurity: No Food Insecurity (12/10/2019)   Hunger Vital Sign    Worried About Running Out of Food in the Last Year: Never true    Ran Out of Food in the Last Year: Never true  Transportation Needs: No Transportation Needs (12/10/2019)   PRAPARE - Hydrologist (Medical): No    Lack of Transportation (Non-Medical): No  Physical Activity: Insufficiently Active (12/10/2019)   Exercise Vital Sign    Days of Exercise per Week: 4 days    Minutes of Exercise per Session: 30 min  Stress: No Stress Concern Present (12/10/2019)   [place]    Feeling of Stress : Only a little  Social Connections: Socially Integrated  (12/10/2019)   Social Connection and Isolation Panel [NHANES]    Frequency of Communication with Friends and Family: More than three times a week    Frequency of Social Gatherings with Friends and Family: More than three times a week    Attends Religious Services: More than 4 times per year    Active Member of [employer] or Organizations: Yes    Attends Music therapist: More than 4 times per year    Marital Status: Married     Family History:  The patient's family history includes Arthritis in his father and mother; Congestive Heart Failure in his father; Diabetes in his brother; Glaucoma in his mother; Heart disease in  his father; Parkinson's disease in his father; Prostate cancer in his father.  ROS:   12 point review of systems is negative unless otherwise noted in HPI.   EKGs/Labs/Other Studies Reviewed:    Studies reviewed were summarized above. The additional studies were reviewed today:  Zio patch 04/2022: Patient had a min HR of 32 bpm, max HR of 176 bpm, and avg HR of 56 bpm. Predominant underlying rhythm was Sinus Rhythm. Slight P wave morphology changes were noted. 1 run of Ventricular Tachycardia occurred lasting 7 beats with a max rate of 139 bpm  (avg 127 bpm). 98 Supraventricular Tachycardia runs occurred, the run with the fastest interval lasting 6 beats with a max rate of 176 bpm, the longest lasting 19.9 secs with an avg rate of 111 bpm. Atrial Fibrillation occurred (<1% burden), ranging from  73-161 bpm (avg of 110 bpm), the longest lasting 1 hour 37 mins with an avg rate of 110 bpm. Atrial Fibrillation was detected within +/- 45 seconds of symptomatic patient event(s). Isolated SVEs were rare (<1.0%), SVE Couplets were rare (<1.0%), and SVE  Triplets were rare (<1.0%). Isolated VEs were occasional (2.1%, 23173), VE Couplets were rare (<1.0%, 145), and VE Triplets were rare (<1.0%, 4). Ventricular Bigeminy and Trigeminy were present. MD notification criteria for  Symptomatic Bradycardia met -  report posted prior to notification per account request __________  2D echo 07/16/2020: 1. Left ventricular ejection fraction, by estimation, is 55 to 60%. The  left ventricle has normal function. The left ventricle has no regional  wall motion abnormalities. Left ventricular diastolic parameters are  consistent with Grade I diastolic  dysfunction (impaired relaxation).   2. Right ventricular systolic function is normal. The right ventricular  size is normal. There is normal pulmonary artery systolic pressure.   3. The mitral valve is normal in structure. No evidence of mitral valve  regurgitation. No evidence of mitral stenosis.   4. The aortic valve is tricuspid. Aortic valve regurgitation is trivial.   5. The inferior vena cava is normal in size with greater than 50%  respiratory variability, suggesting right atrial pressure of 3 mmHg.  __________   Coronary CTA 08/22/2019: Aorta:  Normal size.  No calcifications.  No dissection.   Aortic Valve:  Trileaflet.  No calcifications.   Coronary Arteries:  Normal coronary origin.  Right dominance.   RCA is a large dominant artery that gives rise to PDA and PLA. There is no plaque.   Left main is a large artery that gives rise to LAD and LCX arteries.   LAD is a large vessel that has no plaque.   LCX is a non-dominant artery that gives rise to two obtuse marginal branches. There is no plaque.   Other findings:   Normal pulmonary vein drainage into the left atrium.   Normal left atrial appendage without a thrombus.   Normal size of the pulmonary artery.   IMPRESSION: 1. Coronary calcium score of 0. Patient is low risk for near term coronary events 2. Normal coronary origin with right dominance. 3. No evidence of CAD. __________   Elwyn Reach patch 06/2017: Normal sinus rhythm   Avg HR of 64 bpm.   1 run of Ventricular Tachycardia occurred lasting 4 beats with a max rate of 250 bpm (avg 203 bpm).     66 Supraventricular Tachycardia/atrial tachycardia runs occurred,  run with the fastest interval lasting 7 beats with a max rate of 190 bpm,  longest lasting 32.3 secs with an  avg rate of 97 bpm.    Idioventricular Rhythm was present.    Isolated SVEs were rare (<1.0%), SVE Couplets were rare (<1.0%), and SVE Triplets were rare (<1.0%). Isolated VEs were occasional (3.7%, 35352), VE Couplets were rare (<1.0%, 766),  and VE Triplets were rare (<1.0%, 4). Ventricular Bigeminy and Trigeminy were present.   EKG:  EKG is ordered today.  The EKG ordered today demonstrates sinus bradycardia, 43 bpm, rare PVC, no acute st/t changes  Recent Labs: 07/08/2021: ALT 17; BUN 18; Creatinine, Ser 1.12; Hemoglobin 16.5; Platelets 153; Potassium 4.1; Sodium 142; TSH 2.110  Recent Lipid Panel    Component Value Date/Time   CHOL 184 07/08/2021 0929   TRIG 141 07/08/2021 0929   HDL 34 (L) 07/08/2021 0929   CHOLHDL 5.4 (H) 07/08/2021 0929   CHOLHDL 6.3 07/16/2020 0224   VLDL 27 07/16/2020 0224   LDLCALC 125 (H) 07/08/2021 0929    PHYSICAL EXAM:    VS:  BP (!) 142/68 (BP Location: Left Arm, Patient Position: Sitting, Cuff Size: Normal)   Pulse (!) 43   Ht '5\' 8"'$  (1.727 m)   Wt 166 lb 9.6 oz (75.6 kg)   SpO2 98%   BMI 25.33 kg/m   BMI: Body mass index is 25.33 kg/m.  Physical Exam Vitals reviewed.  Constitutional:      Appearance: He is well-developed.  HENT:     Head: Normocephalic and atraumatic.  Eyes:     General:        Right eye: No discharge.        Left eye: No discharge.  Neck:     Vascular: No JVD.  Cardiovascular:     Rate and Rhythm: Regular rhythm. Bradycardia present.     Heart sounds: Normal heart sounds, S1 normal and S2 normal. Heart sounds not distant. No midsystolic click and no opening snap. No murmur heard.    No friction rub.  Pulmonary:     Effort: Pulmonary effort is normal. No respiratory distress.     Breath sounds: Normal breath sounds. No decreased breath  sounds, wheezing or rales.  Chest:     Chest wall: No tenderness.  Abdominal:     General: There is no distension.  Musculoskeletal:     Cervical back: Normal range of motion.  Skin:    General: Skin is warm and dry.     Nails: There is no clubbing.  Neurological:     Mental Status: He is alert and oriented to person, place, and time.  Psychiatric:        Speech: Speech normal.        Behavior: Behavior normal.        Thought Content: Thought content normal.        Judgment: Judgment normal.     Wt Readings from Last 3 Encounters:  05/26/22 166 lb 9.6 oz (75.6 kg)  04/19/22 168 lb (76.2 kg)  06/28/21 169 lb (76.7 kg)     ASSESSMENT & PLAN:   PAF: Newly diagnosed on Zio patch in 04/2022.  Stop aspirin.  Start apixaban 5 mg twice daily.  CHA2DS2-VASc at least 3 (age x 1, TIA x 2).  HAS-BLED score of 1.  Recent CBC and BMP stable.  Check TSH and magnesium.  Follow-up CBC and BMP in 1 month.  Not requiring AV nodal blocking medication.  Echo pending.  PSVT/PVCs: Asymptomatic.  Not on AV nodal blocking medication with underlying bradycardic rates.  Sinus bradycardia: Asymptomatic, has demonstrated appropriate chronotropic  competence.  Not on AV nodal blocking medication.  Check TSH and magnesium.  Recent potassium at goal.  HLD: LDL 125 in 06/2021.  Calcium score of 0 on coronary CTA.  History of TIA: No new deficits.  Transitioning from aspirin to apixaban given newly diagnosed A-fib as outlined above.  Sleep apnea: Not currently on CPAP.  Contributing to nocturnal bradycardia.    Disposition: F/u with Dr. Rockey Situ or an APP in 1 month.   Medication Adjustments/Labs and Tests Ordered: Current medicines are reviewed at length with the patient today.  Concerns regarding medicines are outlined above. Medication changes, Labs and Tests ordered today are summarized above and listed in the Patient Instructions accessible in Encounters.   Signed, Christell Faith, PA-C 05/26/2022 10:17 AM      [address] [place],  [address] [phone]

## 2022-05-26 ENCOUNTER — Encounter: Payer: Self-pay | Admitting: Physician Assistant

## 2022-05-26 ENCOUNTER — Other Ambulatory Visit
Admission: RE | Admit: 2022-05-26 | Discharge: 2022-05-26 | Disposition: A | Discharge status: Home / Self Care | Payer: Medicare HMO | Source: Ambulatory Visit | Attending: Physician Assistant | Admitting: Physician Assistant

## 2022-05-26 ENCOUNTER — Ambulatory Visit: Payer: Medicare HMO | Attending: Physician Assistant | Admitting: Physician Assistant

## 2022-05-26 VITALS — BP 142/68 | HR 43 | Ht 68.0 in | Wt 166.6 lb

## 2022-05-26 DIAGNOSIS — I480 Paroxysmal atrial fibrillation: Secondary | ICD-10-CM

## 2022-05-26 DIAGNOSIS — I493 Ventricular premature depolarization: Secondary | ICD-10-CM | POA: Diagnosis not present

## 2022-05-26 DIAGNOSIS — R002 Palpitations: Secondary | ICD-10-CM | POA: Insufficient documentation

## 2022-05-26 DIAGNOSIS — I471 Supraventricular tachycardia, unspecified: Secondary | ICD-10-CM

## 2022-05-26 DIAGNOSIS — G459 Transient cerebral ischemic attack, unspecified: Secondary | ICD-10-CM

## 2022-05-26 DIAGNOSIS — R001 Bradycardia, unspecified: Secondary | ICD-10-CM | POA: Diagnosis not present

## 2022-05-26 LAB — MAGNESIUM: Magnesium: 2.3 mg/dL (ref 1.7–2.4)

## 2022-05-26 LAB — TSH: TSH: 1.35 u[IU]/mL (ref 0.350–4.500)

## 2022-05-26 MED ORDER — APIXABAN 5 MG PO TABS: 5.0000 mg | ORAL_TABLET | Freq: Two times a day (BID) | ORAL | 11 refills | Status: DC

## 2022-05-26 NOTE — Patient Instructions (Signed)
Medication Instructions:  Your physician has recommended you make the following change in your medication:   STOP Aspirin START Eliquis 5 mg twice a day  *If you need a refill on your cardiac medications before your next appointment, please call your pharmacy*   Lab Work: Mag & TSH today over at the [facility] entrance. Stop at registration to check in.  If you have labs (blood work) drawn today and your tests are completely normal, you will receive your results only by: [place] (if you have MyChart) OR A paper copy in the mail If you have any lab test that is abnormal or we need to change your treatment, we will call you to review the results.   Testing/Procedures: None   Follow-Up: At [facility], you and your health needs are our priority.  As part of our continuing mission to provide you with exceptional heart care, we have created designated Provider Care Teams.  These Care Teams include your primary Cardiologist (physician) and Advanced Practice Providers (APPs -  Physician Assistants and Nurse Practitioners) who all work together to provide you with the care you need, when you need it.   Your next appointment:   1 month(s)  Provider:   Ida Rogue, MD or Christell Faith, PA-C

## 2022-05-27 ENCOUNTER — Telehealth: Payer: Self-pay | Admitting: Cardiovascular Disease

## 2022-05-27 NOTE — Telephone Encounter (Signed)
Patient is returning call. Please advise? 

## 2022-05-27 NOTE — Telephone Encounter (Signed)
Left detailed voicemail message that labs were normal and no need to call back unless he should have any questions.

## 2022-05-31 ENCOUNTER — Ambulatory Visit: Payer: Medicare HMO | Attending: Physician Assistant

## 2022-05-31 DIAGNOSIS — G459 Transient cerebral ischemic attack, unspecified: Secondary | ICD-10-CM

## 2022-06-01 ENCOUNTER — Telehealth: Payer: Self-pay | Admitting: *Deleted

## 2022-06-01 LAB — ECHOCARDIOGRAM COMPLETE
AR max vel: 1.93 cm2
AV Area VTI: 1.67 cm2
AV Area mean vel: 1.69 cm2
AV Mean grad: 2 mmHg
AV Peak grad: 4.9 mmHg
Ao pk vel: 1.11 m/s
Area-P 1/2: 1.8 cm2
Calc EF: 54.5 %
S' Lateral: 3.5 cm
Single Plane A2C EF: 57.1 %
Single Plane A4C EF: 51.3 %

## 2022-06-01 NOTE — Telephone Encounter (Signed)
Reviewed results with patient. He verbalized understanding and had no further questions at this time.

## 2022-06-01 NOTE — Telephone Encounter (Signed)
-----   Message from Rise Mu, PA-C sent at 06/01/2022  1:45 PM EDT ----- Echo showed normal pump function, normal wall motion, slightly stiffened heart, normal right-sided pump function, normal pressure within the lower right chamber of the heart, mildly leaky mitral valve with prolapse noted, mild to moderate tricuspid valve leakiness, trivial aortic valve leakiness, and a normal pressure within the upper right chamber of the heart.  When compared to echo from 2022, pulm function remains normal, slightly stiffened heart is stable, mitral valve leakiness is new, and aortic valve leakiness is a stable.  Continue medical therapy as outlined at his most recent visit earlier this month.

## 2022-06-06 ENCOUNTER — Ambulatory Visit: Payer: Medicare HMO | Admitting: Physician Assistant

## 2022-06-28 ENCOUNTER — Encounter: Payer: Self-pay | Admitting: Physician Assistant

## 2022-06-28 ENCOUNTER — Ambulatory Visit: Payer: Medicare HMO | Attending: Physician Assistant | Admitting: Physician Assistant

## 2022-06-28 VITALS — BP 128/68 | HR 58 | Ht 68.0 in | Wt 168.6 lb

## 2022-06-28 DIAGNOSIS — R001 Bradycardia, unspecified: Secondary | ICD-10-CM

## 2022-06-28 DIAGNOSIS — R002 Palpitations: Secondary | ICD-10-CM | POA: Diagnosis not present

## 2022-06-28 DIAGNOSIS — I493 Ventricular premature depolarization: Secondary | ICD-10-CM

## 2022-06-28 DIAGNOSIS — I480 Paroxysmal atrial fibrillation: Secondary | ICD-10-CM | POA: Diagnosis not present

## 2022-06-28 DIAGNOSIS — G459 Transient cerebral ischemic attack, unspecified: Secondary | ICD-10-CM | POA: Diagnosis not present

## 2022-06-28 DIAGNOSIS — I471 Supraventricular tachycardia, unspecified: Secondary | ICD-10-CM

## 2022-06-28 NOTE — Patient Instructions (Signed)
Medication Instructions:  No changes at this time.   *If you need a refill on your cardiac medications before your next appointment, please call your pharmacy*   Lab Work: None  If you have labs (blood work) drawn today and your tests are completely normal, you will receive your results only by: MyChart Message (if you have MyChart) OR A paper copy in the mail If you have any lab test that is abnormal or we need to change your treatment, we will call you to review the results.   Testing/Procedures: None   Follow-Up: At [facility], you and your health needs are our priority.  As part of our continuing mission to provide you with exceptional heart care, we have created designated Provider Care Teams.  These Care Teams include your primary Cardiologist (physician) and Advanced Practice Providers (APPs -  Physician Assistants and Nurse Practitioners) who all work together to provide you with the care you need, when you need it.  Your next appointment:   6 month(s)  Provider:   Timothy Gollan, MD or Ryan Dunn, PA-C     

## 2022-06-28 NOTE — Progress Notes (Signed)
Cardiology Office Note    Date:  06/28/2022   ID:  Zachary Medina, DOB 06-02-1950, MRN [id number]  PCP:  Bosie Clos, MD  Cardiologist:  Julien Nordmann, MD  Electrophysiologist:  None   Chief Complaint: Follow-up  History of Present Illness:   Zachary Medina is a 72 y.o. male with history of PAF on Eliquis, PSVT, PVCs, TIA, HLD, nephrolithiasis, OSA not on CPAP, and GERD who presents for follow-up of echo.   He was evaluated in 06/2017 with palpitations with subsequent Zio patch in showing a predominant rhythm of sinus with an average rate of 64 bpm with 1 run of NSVT lasting 4 beats, 66 episodes of SVT lasting up to 32.3 seconds, and occasional isolated PVCs with an overall burden of 3.7%.  He was seen in 07/2019 with chest pain with subsequent coronary CTA in 08/2019 showing a calcium score 0 with no evidence of CAD.   He was admitted to the hospital in 07/2020 with transient right hand and face numbness and weakness felt to be possibly related to a TIA.  During the admission he was bradycardic with PVCs and was having what appeared to be post compensatory pauses.  He was minimally symptomatic.  Echo in 07/2020 showed an EF of 55 to 60%, no regional wall motion abnormalities, grade 1 diastolic dysfunction, normal RV systolic function and ventricular cavity size, normal PASP, trivial aortic insufficiency, and an estimated right atrial pressure of 3 mmHg.   He was evaluated by EP in 08/2020, at the request of PCP for bradycardia and PVCs as noted above.  At that time, he was without symptoms of angina or decompensation.  He remained active at baseline.  There was no indication for further intervention.   He was seen in the office in 04/2022 noting several episodes of palpitations described as a "pounding" that radiated up into his throat and was associated with lightheadedness without frank syncope.  He was without symptoms of angina and remained active at baseline.  Echo was ordered and remains  pending.  Zio patch demonstrated a predominant rhythm of sinus with an average rate of 56 bpm (range 32 to 176 bpm) 1 run of NSVT lasting 7 beats, 98 episodes of SVT with the fastest interval lasting less than 6 beats with a max rate of 176 bpm and the longest interval lasting 19.9 seconds with an average rate of 111 bpm.  A-fib occurred with a less than 1% burden with ventricular rates ranging from 73 to 161 bpm with an average ventricular rate of 110 bpm.  The longest episode of A-fib lasted 1 hour and 37 minutes.  A-fib was detected with symptomatic patient events.  PVCs were occasional with a 2.1% burden.  P wave morphology changes noted.  Nocturnal bradycardia in the 30s bpm was noted.    He was seen in follow-up in 05/2022 and was without symptoms of angina or cardiac decompensation.  He was maintaining sinus rhythm with a long history of asymptomatic bradycardic rates.  Given a CHA2DS2-VASc of at least 3, he was initiated on apixaban.  Echo in 05/2022 demonstrated an EF of 60 to 65%, no regional wall motion abnormalities, grade 1 diastolic dysfunction, normal RV systolic function and ventricular cavity size, normal PASP, mild mitral regurgitation with mild late systolic prolapse of multiple scallops of the posterior leaflet of the mitral valve, mild to moderate tricuspid valve, trivial aortic insufficiency, and an estimated right atrial pressure of 3 mmHg.  He comes in doing well  from a cardiac perspective and is without symptoms of angina or cardiac decompensation.  No dyspnea, palpitations, dizziness, presyncope, or syncope.  No lower extremity swelling or orthopnea.  He has not started apixaban and prefers to defer anticoagulation in the setting of his faith.  Otherwise, he does not have any acute concerns at this time.   Labs independently reviewed: 05/2022 - magnesium 2.3, TSH normal 04/2022 - Hgb 16.0, PLT 135, potassium 4.5, BUN 21, serum creatinine 1.1, albumin 4.5, AST/ALT normal, TC 173, TG  120, HDL 34, LDL 114 06/2021 - A1c 6.0  Past Medical History:  Diagnosis Date   GERD (gastroesophageal reflux disease)    Hepatitis    Hyperlipidemia    Kidney stones    TIA (transient ischemic attack)     Past Surgical History:  Procedure Laterality Date   CHOLECYSTECTOMY     COLONOSCOPY N/A 07/29/2014   Procedure: COLONOSCOPY;  Surgeon: Midge Minium, MD;  Location: [facility] ENDOSCOPY;  Service: Endoscopy;  Laterality: N/A;   HERNIA REPAIR      Current Medications: Current Meds  Medication Sig   Apoaequorin (PREVAGEN PO) Take 1 capsule by mouth daily.   Ascorbic Acid (VITAMIN C PO) Take 1 tablet by mouth daily.   Cyanocobalamin (VITAMIN B-12 PO) Take 1 tablet by mouth daily.   GINKGO BILOBA PO Take 1 capsule by mouth daily.   magnesium oxide (MAG-OX) 400 MG tablet Take 1 tablet (400 mg total) by mouth at bedtime.   nitroGLYCERIN (NITROSTAT) 0.4 MG SL tablet Place 1 tablet (0.4 mg total) under the tongue every 5 (five) minutes as needed for chest pain.   Omega 3 1000 MG CAPS Take 1,000 mg by mouth daily.   Vitamin D, Cholecalciferol, 1000 units TABS Take 2,000 Units by mouth daily.    [DISCONTINUED] apixaban (ELIQUIS) 5 MG TABS tablet Take 1 tablet (5 mg total) by mouth 2 (two) times daily.    Allergies:   Patient has no known allergies.   Social History   Socioeconomic History   Marital status: Married    Spouse name: Not on file   Number of children: 3   Years of education: Not on file   Highest education level: Master's degree (e.g., MA, MS, MEng, MEd, MSW, MBA)  Occupational History   Not on file  Tobacco Use   Smoking status: Never   Smokeless tobacco: Never  Vaping Use   Vaping Use: Never used  Substance and Sexual Activity   Alcohol use: No   Drug use: No   Sexual activity: Not on file  Other Topics Concern   Not on file  Social History Narrative   Not on file   Social Determinants of Health   Financial Resource Strain: Low Risk  (12/10/2019)   Overall  Financial Resource Strain (CARDIA)    Difficulty of Paying Living Expenses: Not hard at all  Food Insecurity: No Food Insecurity (12/10/2019)   Hunger Vital Sign    Worried About Running Out of Food in the Last Year: Never true    Ran Out of Food in the Last Year: Never true  Transportation Needs: No Transportation Needs (12/10/2019)   PRAPARE - Administrator, Civil Service (Medical): No    Lack of Transportation (Non-Medical): No  Physical Activity: Insufficiently Active (12/10/2019)   Exercise Vital Sign    Days of Exercise per Week: 4 days    Minutes of Exercise per Session: 30 min  Stress: No Stress Concern Present (12/10/2019)  [employer] of Occupational Health - Occupational Stress Questionnaire    Feeling of Stress : Only a little  Social Connections: Socially Integrated (12/10/2019)   Social Connection and Isolation Panel [NHANES]    Frequency of Communication with Friends and Family: More than three times a week    Frequency of Social Gatherings with Friends and Family: More than three times a week    Attends Religious Services: More than 4 times per year    Active Member of [employer] or Organizations: Yes    Attends Engineer, structural: More than 4 times per year    Marital Status: Married     Family History:  The patient's family history includes Arthritis in his father and mother; Congestive Heart Failure in his father; Diabetes in his brother; Glaucoma in his mother; Heart disease in his father; Parkinson's disease in his father; Prostate cancer in his father.  ROS:   12-point review of systems is negative unless otherwise noted in the HPI.   EKGs/Labs/Other Studies Reviewed:    Studies reviewed were summarized above. The additional studies were reviewed today:  2D echo 05/31/2022: 1. Left ventricular ejection fraction, by estimation, is 60 to 65%. The  left ventricle has normal function. The left ventricle has no regional  wall motion  abnormalities. Left ventricular diastolic parameters are  consistent with Grade I diastolic  dysfunction (impaired relaxation).   2. Right ventricular systolic function is normal. The right ventricular  size is normal. There is normal pulmonary artery systolic pressure. The  estimated right ventricular systolic pressure is 30.6 mmHg.   3. The mitral valve is normal in structure. Mild mitral valve  regurgitation. No evidence of mitral stenosis. There is mild late systolic  prolapse of multiple scallops of the posterior leaflet of the mitral  valve.   4. Tricuspid valve regurgitation is mild to moderate.   5. The aortic valve is tricuspid. Aortic valve regurgitation is trivial.  No aortic stenosis is present.   6. The inferior vena cava is normal in size with greater than 50%  respiratory variability, suggesting right atrial pressure of 3 mmHg.   Comparison(s): 07/16/20 55-60%.  __________  Luci Bank patch 04/2022: Patient had a min HR of 32 bpm, max HR of 176 bpm, and avg HR of 56 bpm. Predominant underlying rhythm was Sinus Rhythm. Slight P wave morphology changes were noted. 1 run of Ventricular Tachycardia occurred lasting 7 beats with a max rate of 139 bpm  (avg 127 bpm). 98 Supraventricular Tachycardia runs occurred, the run with the fastest interval lasting 6 beats with a max rate of 176 bpm, the longest lasting 19.9 secs with an avg rate of 111 bpm. Atrial Fibrillation occurred (<1% burden), ranging from  73-161 bpm (avg of 110 bpm), the longest lasting 1 hour 37 mins with an avg rate of 110 bpm. Atrial Fibrillation was detected within +/- 45 seconds of symptomatic patient event(s). Isolated SVEs were rare (<1.0%), SVE Couplets were rare (<1.0%), and SVE  Triplets were rare (<1.0%). Isolated VEs were occasional (2.1%, 23173), VE Couplets were rare (<1.0%, 145), and VE Triplets were rare (<1.0%, 4). Ventricular Bigeminy and Trigeminy were present. MD notification criteria for Symptomatic  Bradycardia met -  report posted prior to notification per account request __________   2D echo 07/16/2020: 1. Left ventricular ejection fraction, by estimation, is 55 to 60%. The  left ventricle has normal function. The left ventricle has no regional  wall motion abnormalities. Left ventricular diastolic parameters are  consistent with Grade I diastolic  dysfunction (impaired relaxation).   2. Right ventricular systolic function is normal. The right ventricular  size is normal. There is normal pulmonary artery systolic pressure.   3. The mitral valve is normal in structure. No evidence of mitral valve  regurgitation. No evidence of mitral stenosis.   4. The aortic valve is tricuspid. Aortic valve regurgitation is trivial.   5. The inferior vena cava is normal in size with greater than 50%  respiratory variability, suggesting right atrial pressure of 3 mmHg.  __________   Coronary CTA 08/22/2019: Aorta:  Normal size.  No calcifications.  No dissection.   Aortic Valve:  Trileaflet.  No calcifications.   Coronary Arteries:  Normal coronary origin.  Right dominance.   RCA is a large dominant artery that gives rise to PDA and PLA. There is no plaque.   Left main is a large artery that gives rise to LAD and LCX arteries.   LAD is a large vessel that has no plaque.   LCX is a non-dominant artery that gives rise to two obtuse marginal branches. There is no plaque.   Other findings:   Normal pulmonary vein drainage into the left atrium.   Normal left atrial appendage without a thrombus.   Normal size of the pulmonary artery.   IMPRESSION: 1. Coronary calcium score of 0. Patient is low risk for near term coronary events 2. Normal coronary origin with right dominance. 3. No evidence of CAD. __________   Luci Bank patch 06/2017: Normal sinus rhythm   Avg HR of 64 bpm.   1 run of Ventricular Tachycardia occurred lasting 4 beats with a max rate of 250 bpm (avg 203 bpm).    66  Supraventricular Tachycardia/atrial tachycardia runs occurred,  run with the fastest interval lasting 7 beats with a max rate of 190 bpm,  longest lasting 32.3 secs with an avg rate of 97 bpm.    Idioventricular Rhythm was present.    Isolated SVEs were rare (<1.0%), SVE Couplets were rare (<1.0%), and SVE Triplets were rare (<1.0%). Isolated VEs were occasional (3.7%, [id number]), VE Couplets were rare (<1.0%, 766),  and VE Triplets were rare (<1.0%, 4). Ventricular Bigeminy and Trigeminy were present.   EKG:  EKG is ordered today.  The EKG ordered today demonstrates sinus bradycardia, 58 bpm, no acute ST-T changes  Recent Labs: 07/08/2021: ALT 17; BUN 18; Creatinine, Ser 1.12; Hemoglobin 16.5; Platelets 153; Potassium 4.1; Sodium 142 05/26/2022: Magnesium 2.3; TSH 1.350  Recent Lipid Panel    Component Value Date/Time   CHOL 184 07/08/2021 0929   TRIG 141 07/08/2021 0929   HDL 34 (L) 07/08/2021 0929   CHOLHDL 5.4 (H) 07/08/2021 0929   CHOLHDL 6.3 07/16/2020 0224   VLDL 27 07/16/2020 0224   LDLCALC 125 (H) 07/08/2021 0929    PHYSICAL EXAM:    VS:  BP 128/68 (BP Location: Left Arm, Patient Position: Sitting, Cuff Size: Normal)   Pulse (!) 58   Ht 5\' 8"  (1.727 m)   Wt 168 lb 9.6 oz (76.5 kg)   SpO2 98%   BMI 25.64 kg/m   BMI: Body mass index is 25.64 kg/m.  Physical Exam Vitals reviewed.  Constitutional:      Appearance: He is well-developed.  HENT:     Head: Normocephalic and atraumatic.  Eyes:     General:        Right eye: No discharge.        Left eye: No discharge.  Neck:     Vascular: No JVD.  Cardiovascular:     Rate and Rhythm: Normal rate and regular rhythm.     Heart sounds: Normal heart sounds, S1 normal and S2 normal. Heart sounds not distant. No midsystolic click and no opening snap. No murmur heard.    No friction rub.  Pulmonary:     Effort: Pulmonary effort is normal. No respiratory distress.     Breath sounds: Normal breath sounds. No decreased breath  sounds, wheezing or rales.  Chest:     Chest wall: No tenderness.  Abdominal:     General: There is no distension.  Musculoskeletal:     Cervical back: Normal range of motion.     Right lower leg: No edema.     Left lower leg: No edema.  Skin:    General: Skin is warm and dry.     Nails: There is no clubbing.  Neurological:     Mental Status: He is alert and oriented to person, place, and time.  Psychiatric:        Speech: Speech normal.        Behavior: Behavior normal.        Thought Content: Thought content normal.        Judgment: Judgment normal.     Wt Readings from Last 3 Encounters:  06/28/22 168 lb 9.6 oz (76.5 kg)  05/26/22 166 lb 9.6 oz (75.6 kg)  04/19/22 168 lb (76.2 kg)     ASSESSMENT & PLAN:   PAF: Maintaining sinus rhythm.  Not requiring AV nodal blocking medication secondary to resting sinus rate.  CHA2DS2-VASc at least 3 (age x 1, TIA x 2) giving him an estimated 3.2% risk of stroke annually.  Declines anticoagulation.  Aware of stroke risk.  Recommend revisiting in follow-up.  PSVT/PVCs: Asymptomatic.  Not on AV nodal blocking medication secondary to underlying sinus bradycardic rates.  Sinus bradycardia: Asymptomatic.  Has demonstrated appropriate chronotropic competence.  HLD: LDL 125 in 06/2021.  Calcium score of 0 on coronary CTA.  History of TIA: No new deficits.  Sleep apnea: Not currently on CPAP.  Contributing to nocturnal bradycardia.   Disposition: F/u with Dr. Mariah Milling or an APP in 6 months.   Medication Adjustments/Labs and Tests Ordered: Current medicines are reviewed at length with the patient today.  Concerns regarding medicines are outlined above. Medication changes, Labs and Tests ordered today are summarized above and listed in the Patient Instructions accessible in Encounters.   Signed, Eula Listen, PA-C 06/28/2022 8:51 AM      [facility] [address] [place], [place] [address] [phone]

## 2022-07-21 DIAGNOSIS — R972 Elevated prostate specific antigen [PSA]: Secondary | ICD-10-CM | POA: Diagnosis not present

## 2022-07-29 DIAGNOSIS — R1032 Left lower quadrant pain: Secondary | ICD-10-CM | POA: Diagnosis not present

## 2022-07-29 DIAGNOSIS — R1031 Right lower quadrant pain: Secondary | ICD-10-CM | POA: Diagnosis not present

## 2022-07-29 DIAGNOSIS — R350 Frequency of micturition: Secondary | ICD-10-CM | POA: Diagnosis not present

## 2022-07-29 DIAGNOSIS — N401 Enlarged prostate with lower urinary tract symptoms: Secondary | ICD-10-CM | POA: Diagnosis not present

## 2022-07-29 DIAGNOSIS — R109 Unspecified abdominal pain: Secondary | ICD-10-CM | POA: Diagnosis not present

## 2022-07-29 DIAGNOSIS — Z87442 Personal history of urinary calculi: Secondary | ICD-10-CM | POA: Diagnosis not present

## 2022-08-30 DIAGNOSIS — Z09 Encounter for follow-up examination after completed treatment for conditions other than malignant neoplasm: Secondary | ICD-10-CM | POA: Diagnosis not present

## 2022-08-30 DIAGNOSIS — L570 Actinic keratosis: Secondary | ICD-10-CM | POA: Diagnosis not present

## 2022-08-30 DIAGNOSIS — Z872 Personal history of diseases of the skin and subcutaneous tissue: Secondary | ICD-10-CM | POA: Diagnosis not present

## 2022-08-30 DIAGNOSIS — D2272 Melanocytic nevi of left lower limb, including hip: Secondary | ICD-10-CM | POA: Diagnosis not present

## 2022-08-30 DIAGNOSIS — D2261 Melanocytic nevi of right upper limb, including shoulder: Secondary | ICD-10-CM | POA: Diagnosis not present

## 2022-08-30 DIAGNOSIS — D225 Melanocytic nevi of trunk: Secondary | ICD-10-CM | POA: Diagnosis not present

## 2022-08-30 DIAGNOSIS — D2262 Melanocytic nevi of left upper limb, including shoulder: Secondary | ICD-10-CM | POA: Diagnosis not present

## 2023-01-26 DIAGNOSIS — R972 Elevated prostate specific antigen [PSA]: Secondary | ICD-10-CM | POA: Diagnosis not present

## 2023-01-26 DIAGNOSIS — E7800 Pure hypercholesterolemia, unspecified: Secondary | ICD-10-CM | POA: Diagnosis not present

## 2023-01-26 DIAGNOSIS — R739 Hyperglycemia, unspecified: Secondary | ICD-10-CM | POA: Diagnosis not present

## 2023-01-26 DIAGNOSIS — R0681 Apnea, not elsewhere classified: Secondary | ICD-10-CM | POA: Diagnosis not present

## 2023-02-23 DIAGNOSIS — H0288A Meibomian gland dysfunction right eye, upper and lower eyelids: Secondary | ICD-10-CM | POA: Diagnosis not present

## 2023-02-23 DIAGNOSIS — H43393 Other vitreous opacities, bilateral: Secondary | ICD-10-CM | POA: Diagnosis not present

## 2023-02-23 DIAGNOSIS — H2513 Age-related nuclear cataract, bilateral: Secondary | ICD-10-CM | POA: Diagnosis not present

## 2023-02-23 DIAGNOSIS — H0288B Meibomian gland dysfunction left eye, upper and lower eyelids: Secondary | ICD-10-CM | POA: Diagnosis not present

## 2023-07-24 NOTE — Progress Notes (Unsigned)
 Cardiology Office Note    Date:  07/25/2023   ID:  Zachary Medina, DOB December 29, 1950, MRN [id number]  PCP:  Nikki Barters, MD  Cardiologist:  Belva Boyden, MD  Electrophysiologist:  None   Chief Complaint: Follow-up  History of Present Illness:   Zachary Medina is a 73 y.o. male with history of PAF on Eliquis , PSVT, PVCs, TIA, HLD, nephrolithiasis, OSA not on CPAP, and GERD who presents for follow-up of Afib.   He was evaluated in 06/2017 with palpitations with subsequent Zio patch in showing a predominant rhythm of sinus with an average rate of 64 bpm with 1 run of NSVT lasting 4 beats, 66 episodes of SVT lasting up to 32.3 seconds, and occasional isolated PVCs with an overall burden of 3.7%.  He was seen in 07/2019 with chest pain with subsequent coronary CTA in 08/2019 showing a calcium  score 0 with no evidence of CAD.   He was admitted to the hospital in 07/2020 with transient right hand and face numbness and weakness felt to be possibly related to a TIA.  During the admission he was bradycardic with PVCs and was having what appeared to be post compensatory pauses.  He was minimally symptomatic.  Echo in 07/2020 showed an EF of 55 to 60%, no regional wall motion abnormalities, grade 1 diastolic dysfunction, normal RV systolic function and ventricular cavity size, normal PASP, trivial aortic insufficiency, and an estimated right atrial pressure of 3 mmHg.   He was evaluated by EP in 08/2020, at the request of PCP for bradycardia and PVCs as noted above.  At that time, he was without symptoms of angina or decompensation.  He remained active at baseline.  There was no indication for further intervention.   He was seen in the office in 04/2022 noting several episodes of palpitations described as a "pounding" that radiated up into his throat and was associated with lightheadedness without frank syncope.  He was without symptoms of angina and remained active at baseline.  Zio patch demonstrated a  predominant rhythm of sinus with an average rate of 56 bpm (range 32 to 176 bpm) 1 run of NSVT lasting 7 beats, 98 episodes of SVT with the fastest interval lasting less than 6 beats with a max rate of 176 bpm and the longest interval lasting 19.9 seconds with an average rate of 111 bpm.  A-fib occurred with a less than 1% burden with ventricular rates ranging from 73 to 161 bpm with an average ventricular rate of 110 bpm.  The longest episode of A-fib lasted 1 hour and 37 minutes.  A-fib was detected with symptomatic patient events.  PVCs were occasional with a 2.1% burden.  P wave morphology changes noted.  Nocturnal bradycardia in the 30s bpm was noted.  Echo in 05/2022 demonstrated an EF of 60 to 65%, no regional wall motion abnormalities, grade 1 diastolic dysfunction, normal RV systolic function and ventricular cavity size, normal PASP, mild mitral regurgitation with mild late systolic prolapse of multiple scallops of the posterior leaflet of the mitral valve, mild to moderate tricuspid valve, trivial aortic insufficiency, and an estimated right atrial pressure of 3 mmHg.   He was seen in follow-up in 05/2022 and was maintaining sinus rhythm with a long history of asymptomatic bradycardic rates.  Given a CHA2DS2-VASc of at least 3, it was recommended he start apixaban .  He was last seen in the office in 06/2022 and was doing very well from a cardiac perspective.  He had not  started apixaban  and preferred to defer anticoagulation in the setting of his faith.  He was made aware of his stroke risk.  He comes in continuing to do well from a cardiac perspective and is without symptoms of angina or cardiac decompensation.  No significant dyspnea, dizziness, presyncope, or syncope.  He does note some stable brief intermittent palpitations that are typically several seconds to 1 or 2 minutes in duration and are without associated symptoms.  No lower extremity swelling or orthopnea.  No falls or symptoms concerning  for bleeding.  Prefers to remain off OAC.  Remains active at baseline working out at the [facility] on the treadmill for 2 to 3 miles several days per week without cardiac limitation.   Labs independently reviewed: 01/2023 - potassium 4.3, BUN 20, serum creatinine 1, albumin 4.3, AST/ALT normal, A1c 6.2 05/2022 - magnesium  2.3, TSH normal 04/2022 - Hgb 16.0, PLT 135, TC 173, TG 120, HDL 34, LDL 114  Past Medical History:  Diagnosis Date   GERD (gastroesophageal reflux disease)    Hepatitis    Hyperlipidemia    Kidney stones    TIA (transient ischemic attack)     Past Surgical History:  Procedure Laterality Date   CHOLECYSTECTOMY     COLONOSCOPY N/A 07/29/2014   Procedure: COLONOSCOPY;  Surgeon: Marnee Sink, MD;  Location: [facility] ENDOSCOPY;  Service: Endoscopy;  Laterality: N/A;   HERNIA REPAIR      Current Medications: Current Meds  Medication Sig   Apoaequorin (PREVAGEN PO) Take 1 capsule by mouth daily.   Ascorbic Acid (VITAMIN C PO) Take 1 tablet by mouth daily.   aspirin  EC 81 MG tablet Take 81 mg by mouth daily. Swallow whole.   Cyanocobalamin (VITAMIN B-12 PO) Take 1 tablet by mouth daily.   GINKGO BILOBA PO Take 1 capsule by mouth daily.   magnesium  oxide (MAG-OX) 400 MG tablet Take 1 tablet (400 mg total) by mouth at bedtime.   nitroGLYCERIN  (NITROSTAT ) 0.4 MG SL tablet Place 1 tablet (0.4 mg total) under the tongue every 5 (five) minutes as needed for chest pain.   Omega 3 1000 MG CAPS Take 1,000 mg by mouth daily.   Vitamin D, Cholecalciferol, 1000 units TABS Take 2,000 Units by mouth daily.     Allergies:   Patient has no known allergies.   Social History   Socioeconomic History   Marital status: Married    Spouse name: Not on file   Number of children: 3   Years of education: Not on file   Highest education level: Master's degree (e.g., MA, MS, MEng, MEd, MSW, MBA)  Occupational History   Not on file  Tobacco Use   Smoking status: Never   Smokeless tobacco: Never   Vaping Use   Vaping status: Never Used  Substance and Sexual Activity   Alcohol use: No   Drug use: No   Sexual activity: Not on file  Other Topics Concern   Not on file  Social History Narrative   Not on file   Social Drivers of Health   Financial Resource Strain: Low Risk  (04/06/2023)   Received from [facility]   Overall Financial Resource Strain (CARDIA)    Difficulty of Paying Living Expenses: Not hard at all  Food Insecurity: No Food Insecurity (04/06/2023)   Received from [facility]   Hunger Vital Sign    Worried About Running Out of Food in the Last Year: Never true    Ran Out  of Food in the Last Year: Never true  Transportation Needs: No Transportation Needs (04/06/2023)   Received from [facility] - Transportation    In the past 12 months, has lack of transportation kept you from medical appointments or from getting medications?: No    Lack of Transportation (Non-Medical): No  Physical Activity: Sufficiently Active (04/06/2023)   Received from [facility]   Exercise Vital Sign    Days of Exercise per Week: 3 days    Minutes of Exercise per Session: 60 min  Stress: No Stress Concern Present (12/10/2019)   [employer] of Occupational Health - Occupational Stress Questionnaire    Feeling of Stress : Only a little  Social Connections: Socially Integrated (12/10/2019)   Social Connection and Isolation Panel [NHANES]    Frequency of Communication with Friends and Family: More than three times a week    Frequency of Social Gatherings with Friends and Family: More than three times a week    Attends Religious Services: More than 4 times per year    Active Member of [employer] or Organizations: Yes    Attends Engineer, structural: More than 4 times per year    Marital Status: Married     Family History:  The patient's family history includes Arthritis in his father and mother;  Congestive Heart Failure in his father; Diabetes in his brother; Glaucoma in his mother; Heart disease in his father; Parkinson's disease in his father; Prostate cancer in his father.  ROS:   12-point review of systems is negative unless otherwise noted in the HPI.   EKGs/Labs/Other Studies Reviewed:    Studies reviewed were summarized above. The additional studies were reviewed today:  2D echo 05/31/2022: 1. Left ventricular ejection fraction, by estimation, is 60 to 65%. The  left ventricle has normal function. The left ventricle has no regional  wall motion abnormalities. Left ventricular diastolic parameters are  consistent with Grade I diastolic  dysfunction (impaired relaxation).   2. Right ventricular systolic function is normal. The right ventricular  size is normal. There is normal pulmonary artery systolic pressure. The  estimated right ventricular systolic pressure is 30.6 mmHg.   3. The mitral valve is normal in structure. Mild mitral valve  regurgitation. No evidence of mitral stenosis. There is mild late systolic  prolapse of multiple scallops of the posterior leaflet of the mitral  valve.   4. Tricuspid valve regurgitation is mild to moderate.   5. The aortic valve is tricuspid. Aortic valve regurgitation is trivial.  No aortic stenosis is present.   6. The inferior vena cava is normal in size with greater than 50%  respiratory variability, suggesting right atrial pressure of 3 mmHg.   Comparison(s): 07/16/20 55-60%.  __________   Zio patch 04/2022: Patient had a min HR of 32 bpm, max HR of 176 bpm, and avg HR of 56 bpm. Predominant underlying rhythm was Sinus Rhythm. Slight P wave morphology changes were noted. 1 run of Ventricular Tachycardia occurred lasting 7 beats with a max rate of 139 bpm  (avg 127 bpm). 98 Supraventricular Tachycardia runs occurred, the run with the fastest interval lasting 6 beats with a max rate of 176 bpm, the longest lasting 19.9 secs with  an avg rate of 111 bpm. Atrial Fibrillation occurred (<1% burden), ranging from  73-161 bpm (avg of 110 bpm), the longest lasting 1 hour 37 mins with an avg rate of 110 bpm. Atrial Fibrillation was detected  within +/- 45 seconds of symptomatic patient event(s). Isolated SVEs were rare (<1.0%), SVE Couplets were rare (<1.0%), and SVE  Triplets were rare (<1.0%). Isolated VEs were occasional (2.1%, 23173), VE Couplets were rare (<1.0%, 145), and VE Triplets were rare (<1.0%, 4). Ventricular Bigeminy and Trigeminy were present. MD notification criteria for Symptomatic Bradycardia met -  report posted prior to notification per account request __________   2D echo 07/16/2020: 1. Left ventricular ejection fraction, by estimation, is 55 to 60%. The  left ventricle has normal function. The left ventricle has no regional  wall motion abnormalities. Left ventricular diastolic parameters are  consistent with Grade I diastolic  dysfunction (impaired relaxation).   2. Right ventricular systolic function is normal. The right ventricular  size is normal. There is normal pulmonary artery systolic pressure.   3. The mitral valve is normal in structure. No evidence of mitral valve  regurgitation. No evidence of mitral stenosis.   4. The aortic valve is tricuspid. Aortic valve regurgitation is trivial.   5. The inferior vena cava is normal in size with greater than 50%  respiratory variability, suggesting right atrial pressure of 3 mmHg.  __________   Coronary CTA 08/22/2019: Aorta:  Normal size.  No calcifications.  No dissection.   Aortic Valve:  Trileaflet.  No calcifications.   Coronary Arteries:  Normal coronary origin.  Right dominance.   RCA is a large dominant artery that gives rise to PDA and PLA. There is no plaque.   Left main is a large artery that gives rise to LAD and LCX arteries.   LAD is a large vessel that has no plaque.   LCX is a non-dominant artery that gives rise to two obtuse  marginal branches. There is no plaque.   Other findings:   Normal pulmonary vein drainage into the left atrium.   Normal left atrial appendage without a thrombus.   Normal size of the pulmonary artery.   IMPRESSION: 1. Coronary calcium  score of 0. Patient is low risk for near term coronary events 2. Normal coronary origin with right dominance. 3. No evidence of CAD. __________   Zio patch 06/2017: Normal sinus rhythm   Avg HR of 64 bpm.   1 run of Ventricular Tachycardia occurred lasting 4 beats with a max rate of 250 bpm (avg 203 bpm).    66 Supraventricular Tachycardia/atrial tachycardia runs occurred,  run with the fastest interval lasting 7 beats with a max rate of 190 bpm,  longest lasting 32.3 secs with an avg rate of 97 bpm.    Idioventricular Rhythm was present.    Isolated SVEs were rare (<1.0%), SVE Couplets were rare (<1.0%), and SVE Triplets were rare (<1.0%). Isolated VEs were occasional (3.7%, 35352), VE Couplets were rare (<1.0%, 766),  and VE Triplets were rare (<1.0%, 4). Ventricular Bigeminy and Trigeminy were present.   EKG:  EKG is not ordered today.    Recent Labs: No results found for requested labs within last 365 days.  Recent Lipid Panel    Component Value Date/Time   CHOL 184 07/08/2021 0929   TRIG 141 07/08/2021 0929   HDL 34 (L) 07/08/2021 0929   CHOLHDL 5.4 (H) 07/08/2021 0929   CHOLHDL 6.3 07/16/2020 0224   VLDL 27 07/16/2020 0224   LDLCALC 125 (H) 07/08/2021 0929    PHYSICAL EXAM:    VS:  BP 122/72 (BP Location: Left Arm, Patient Position: Sitting, Cuff Size: Normal)   Pulse (!) 57   Ht 5\' 8"  (  1.727 m)   Wt 165 lb (74.8 kg)   SpO2 94%   BMI 25.09 kg/m   BMI: Body mass index is 25.09 kg/m.  Physical Exam Vitals reviewed.  Constitutional:      Appearance: He is well-developed.  HENT:     Head: Normocephalic and atraumatic.  Eyes:     General:        Right eye: No discharge.        Left eye: No discharge.   Cardiovascular:     Rate and Rhythm: Normal rate and regular rhythm.     Heart sounds: Normal heart sounds, S1 normal and S2 normal. Heart sounds not distant. No midsystolic click and no opening snap. No murmur heard.    No friction rub.  Pulmonary:     Effort: Pulmonary effort is normal. No respiratory distress.     Breath sounds: Normal breath sounds. No decreased breath sounds, wheezing, rhonchi or rales.  Chest:     Chest wall: No tenderness.  Musculoskeletal:     Cervical back: Normal range of motion.     Right lower leg: No edema.     Left lower leg: No edema.  Skin:    General: Skin is warm and dry.     Nails: There is no clubbing.  Neurological:     Mental Status: He is alert and oriented to person, place, and time.  Psychiatric:        Speech: Speech normal.        Behavior: Behavior normal.        Thought Content: Thought content normal.        Judgment: Judgment normal.     Wt Readings from Last 3 Encounters:  07/25/23 165 lb (74.8 kg)  06/28/22 168 lb 9.6 oz (76.5 kg)  05/26/22 166 lb 9.6 oz (75.6 kg)     ASSESSMENT & PLAN:   PAF: Maintaining sinus rhythm by physical exam, not requiring AV nodal blocking medication secondary to resting sinus heart rate.  CHA2DS2-VASc at least 3 (age x 1, TIA x 2).  Prefers to avoid anticoagulation.  Aware of stroke risk.  PSVT/PVCs: Symptoms overall stable.  Not on AV nodal blocking medications secondary to underlying sinus bradycardic rates.  Sinus bradycardia: Asymptomatic.  Has demonstrated appropriate chronotropic competence.  HLD: LDL 114 04/2022.  Calcium  score of 0 on coronary CTA.  History of TIA: No new deficits.  Sleep apnea: Not currently on CPAP.  Contributing to nocturnal bradycardia.    Disposition: F/u with Dr. Gollan or an APP in 12 months, sooner if needed.   Medication Adjustments/Labs and Tests Ordered: Current medicines are reviewed at length with the patient today.  Concerns regarding medicines are  outlined above. Medication changes, Labs and Tests ordered today are summarized above and listed in the Patient Instructions accessible in Encounters.   Signed, Varney Gentleman, PA-C 07/25/2023 5:02 PM     [facility] [place] [address] [place], [place] [address] [phone]

## 2023-07-25 ENCOUNTER — Encounter: Payer: Self-pay | Admitting: Physician Assistant

## 2023-07-25 ENCOUNTER — Ambulatory Visit: Attending: Physician Assistant | Admitting: Physician Assistant

## 2023-07-25 VITALS — BP 122/72 | HR 57 | Ht 68.0 in | Wt 165.0 lb

## 2023-07-25 DIAGNOSIS — R002 Palpitations: Secondary | ICD-10-CM | POA: Diagnosis not present

## 2023-07-25 DIAGNOSIS — R001 Bradycardia, unspecified: Secondary | ICD-10-CM

## 2023-07-25 DIAGNOSIS — I480 Paroxysmal atrial fibrillation: Secondary | ICD-10-CM

## 2023-07-25 DIAGNOSIS — G459 Transient cerebral ischemic attack, unspecified: Secondary | ICD-10-CM | POA: Diagnosis not present

## 2023-07-25 DIAGNOSIS — I471 Supraventricular tachycardia, unspecified: Secondary | ICD-10-CM

## 2023-07-25 DIAGNOSIS — I493 Ventricular premature depolarization: Secondary | ICD-10-CM | POA: Diagnosis not present

## 2023-07-25 NOTE — Patient Instructions (Signed)
 Medication Instructions:  Your Physician recommend you continue on your current medication as directed.    *If you need a refill on your cardiac medications before your next appointment, please call your pharmacy*  Follow-Up: At [facility], you and your health needs are our priority.  As part of our continuing mission to provide you with exceptional heart care, our providers are all part of one team.  This team includes your primary Cardiologist (physician) and Advanced Practice Providers or APPs (Physician Assistants and Nurse Practitioners) who all work together to provide you with the care you need, when you need it.  Your next appointment:   12 month(s)  Provider:   Varney Gentleman, PA-C    We recommend signing up for the patient portal called "MyChart".  Sign up information is provided on this After Visit Summary.  MyChart is used to connect with patients for Virtual Visits (Telemedicine).  Patients are able to view lab/test results, encounter notes, upcoming appointments, etc.  Non-urgent messages can be sent to your provider as well.   To learn more about what you can do with MyChart, go to [email or web address].
